# Patient Record
Sex: Male | Born: 1935 | Race: White | Hispanic: No | Marital: Married | Smoking: Former smoker
Health system: Southern US, Community
[De-identification: ages and names within clinical notes are randomized; demographics above are authoritative.]

## PROBLEM LIST (undated history)

## (undated) DIAGNOSIS — I495 Sick sinus syndrome: Secondary | ICD-10-CM

## (undated) DIAGNOSIS — I10 Essential (primary) hypertension: Secondary | ICD-10-CM

## (undated) DIAGNOSIS — E119 Type 2 diabetes mellitus without complications: Secondary | ICD-10-CM

## (undated) DIAGNOSIS — E041 Nontoxic single thyroid nodule: Secondary | ICD-10-CM

## (undated) DIAGNOSIS — I2510 Atherosclerotic heart disease of native coronary artery without angina pectoris: Secondary | ICD-10-CM

## (undated) DIAGNOSIS — E785 Hyperlipidemia, unspecified: Secondary | ICD-10-CM

## (undated) DIAGNOSIS — I4891 Unspecified atrial fibrillation: Secondary | ICD-10-CM

## (undated) DIAGNOSIS — M1120 Other chondrocalcinosis, unspecified site: Principal | ICD-10-CM

## (undated) DIAGNOSIS — N400 Enlarged prostate without lower urinary tract symptoms: Secondary | ICD-10-CM

## (undated) DIAGNOSIS — M5136 Other intervertebral disc degeneration, lumbar region: Secondary | ICD-10-CM

## (undated) HISTORY — DX: Sick sinus syndrome: I49.5

## (undated) HISTORY — DX: Hyperlipidemia, unspecified: E78.5

## (undated) HISTORY — DX: Nontoxic single thyroid nodule: E04.1

## (undated) HISTORY — PX: TONSILLECTOMY: SHX5217

## (undated) HISTORY — DX: Atherosclerotic heart disease of native coronary artery without angina pectoris: I25.10

## (undated) HISTORY — DX: Benign prostatic hyperplasia without lower urinary tract symptoms: N40.0

## (undated) HISTORY — DX: Unspecified atrial fibrillation: I48.91

## (undated) HISTORY — DX: Essential (primary) hypertension: I10

## (undated) HISTORY — PX: OTHER SURGICAL HISTORY: SHX169

## (undated) HISTORY — DX: Type 2 diabetes mellitus without complications: E11.9

## (undated) HISTORY — DX: Other intervertebral disc degeneration, lumbar region: M51.36

## (undated) HISTORY — DX: Other chondrocalcinosis, unspecified site: M11.20

---

## 1972-03-30 DIAGNOSIS — I2510 Atherosclerotic heart disease of native coronary artery without angina pectoris: Secondary | ICD-10-CM

## 1972-03-30 HISTORY — DX: Atherosclerotic heart disease of native coronary artery without angina pectoris: I25.10

## 1992-03-30 HISTORY — PX: CORONARY ARTERY BYPASS GRAFT: SHX141

## 1998-02-06 ENCOUNTER — Emergency Department (HOSPITAL_COMMUNITY): Admission: EM | Admit: 1998-02-06 | Discharge: 1998-02-06 | Payer: Self-pay | Admitting: Emergency Medicine

## 2000-07-05 ENCOUNTER — Encounter: Payer: Self-pay | Admitting: Emergency Medicine

## 2000-07-05 ENCOUNTER — Inpatient Hospital Stay (HOSPITAL_COMMUNITY): Admission: EM | Admit: 2000-07-05 | Discharge: 2000-07-06 | Payer: Self-pay | Admitting: Emergency Medicine

## 2001-03-30 DIAGNOSIS — E119 Type 2 diabetes mellitus without complications: Secondary | ICD-10-CM

## 2001-03-30 HISTORY — DX: Type 2 diabetes mellitus without complications: E11.9

## 2002-09-23 ENCOUNTER — Inpatient Hospital Stay (HOSPITAL_COMMUNITY): Admission: EM | Admit: 2002-09-23 | Discharge: 2002-09-30 | Payer: Self-pay | Admitting: Emergency Medicine

## 2002-09-23 ENCOUNTER — Encounter: Payer: Self-pay | Admitting: Emergency Medicine

## 2002-09-27 ENCOUNTER — Encounter: Payer: Self-pay | Admitting: Internal Medicine

## 2003-04-15 ENCOUNTER — Emergency Department (HOSPITAL_COMMUNITY): Admission: EM | Admit: 2003-04-15 | Discharge: 2003-04-15 | Payer: Self-pay | Admitting: Emergency Medicine

## 2003-04-23 ENCOUNTER — Encounter: Admission: RE | Admit: 2003-04-23 | Discharge: 2003-04-23 | Payer: Self-pay | Admitting: Internal Medicine

## 2004-02-18 ENCOUNTER — Ambulatory Visit: Payer: Self-pay | Admitting: Cardiovascular Disease

## 2004-03-03 ENCOUNTER — Ambulatory Visit: Payer: Self-pay | Admitting: Cardiology

## 2004-03-06 ENCOUNTER — Ambulatory Visit: Payer: Self-pay | Admitting: Cardiology

## 2004-03-17 ENCOUNTER — Ambulatory Visit: Payer: Self-pay | Admitting: Cardiology

## 2004-04-02 ENCOUNTER — Ambulatory Visit: Payer: Self-pay | Admitting: Internal Medicine

## 2004-04-14 ENCOUNTER — Ambulatory Visit: Payer: Self-pay | Admitting: *Deleted

## 2004-04-28 ENCOUNTER — Ambulatory Visit: Payer: Self-pay | Admitting: Internal Medicine

## 2004-05-12 ENCOUNTER — Ambulatory Visit: Payer: Self-pay | Admitting: Cardiology

## 2004-06-02 ENCOUNTER — Ambulatory Visit: Payer: Self-pay | Admitting: Cardiology

## 2004-06-03 ENCOUNTER — Ambulatory Visit: Payer: Self-pay | Admitting: Internal Medicine

## 2004-06-24 ENCOUNTER — Encounter: Admission: RE | Admit: 2004-06-24 | Discharge: 2004-09-22 | Payer: Self-pay | Admitting: Internal Medicine

## 2004-06-30 ENCOUNTER — Ambulatory Visit: Payer: Self-pay | Admitting: Cardiology

## 2004-07-28 ENCOUNTER — Ambulatory Visit: Payer: Self-pay | Admitting: *Deleted

## 2004-08-26 ENCOUNTER — Ambulatory Visit: Payer: Self-pay | Admitting: Internal Medicine

## 2004-09-15 ENCOUNTER — Ambulatory Visit: Payer: Self-pay | Admitting: Internal Medicine

## 2004-09-23 ENCOUNTER — Ambulatory Visit: Payer: Self-pay | Admitting: Cardiology

## 2004-09-26 ENCOUNTER — Ambulatory Visit: Payer: Self-pay | Admitting: Cardiology

## 2004-10-06 ENCOUNTER — Ambulatory Visit: Payer: Self-pay | Admitting: Cardiology

## 2004-10-07 ENCOUNTER — Ambulatory Visit: Payer: Self-pay | Admitting: Cardiology

## 2004-10-15 ENCOUNTER — Ambulatory Visit: Payer: Self-pay | Admitting: Internal Medicine

## 2004-10-16 ENCOUNTER — Encounter: Admission: RE | Admit: 2004-10-16 | Discharge: 2005-01-14 | Payer: Self-pay | Admitting: Internal Medicine

## 2004-10-27 ENCOUNTER — Ambulatory Visit: Payer: Self-pay | Admitting: Internal Medicine

## 2004-10-28 ENCOUNTER — Ambulatory Visit: Payer: Self-pay

## 2004-11-25 ENCOUNTER — Ambulatory Visit: Payer: Self-pay | Admitting: Cardiology

## 2004-12-05 ENCOUNTER — Ambulatory Visit: Payer: Self-pay

## 2004-12-08 ENCOUNTER — Ambulatory Visit: Payer: Self-pay | Admitting: Cardiology

## 2004-12-19 ENCOUNTER — Ambulatory Visit: Payer: Self-pay | Admitting: Cardiology

## 2005-01-09 ENCOUNTER — Ambulatory Visit: Payer: Self-pay | Admitting: Internal Medicine

## 2005-01-12 ENCOUNTER — Ambulatory Visit: Payer: Self-pay | Admitting: Cardiology

## 2005-01-22 ENCOUNTER — Ambulatory Visit: Payer: Self-pay | Admitting: Internal Medicine

## 2005-01-26 ENCOUNTER — Encounter (INDEPENDENT_AMBULATORY_CARE_PROVIDER_SITE_OTHER): Payer: Self-pay | Admitting: *Deleted

## 2005-01-26 ENCOUNTER — Ambulatory Visit: Payer: Self-pay | Admitting: Internal Medicine

## 2005-01-26 LAB — CONVERTED CEMR LAB: RBC count: 4.22 10*6/uL

## 2005-02-17 ENCOUNTER — Ambulatory Visit: Payer: Self-pay | Admitting: Internal Medicine

## 2005-02-20 ENCOUNTER — Ambulatory Visit: Payer: Self-pay | Admitting: Internal Medicine

## 2005-03-13 ENCOUNTER — Ambulatory Visit: Payer: Self-pay | Admitting: Cardiology

## 2005-04-02 ENCOUNTER — Ambulatory Visit: Payer: Self-pay | Admitting: Cardiology

## 2005-04-30 ENCOUNTER — Ambulatory Visit: Payer: Self-pay | Admitting: Cardiology

## 2005-05-01 ENCOUNTER — Ambulatory Visit: Payer: Self-pay | Admitting: Internal Medicine

## 2005-05-28 ENCOUNTER — Ambulatory Visit: Payer: Self-pay | Admitting: Cardiology

## 2005-05-28 ENCOUNTER — Ambulatory Visit: Payer: Self-pay | Admitting: Internal Medicine

## 2005-06-03 ENCOUNTER — Ambulatory Visit: Payer: Self-pay | Admitting: Cardiology

## 2005-06-09 ENCOUNTER — Ambulatory Visit: Payer: Self-pay

## 2005-06-18 ENCOUNTER — Ambulatory Visit (HOSPITAL_COMMUNITY): Admission: RE | Admit: 2005-06-18 | Discharge: 2005-06-18 | Payer: Self-pay | Admitting: Cardiology

## 2005-06-23 ENCOUNTER — Ambulatory Visit: Payer: Self-pay | Admitting: Cardiology

## 2005-06-25 ENCOUNTER — Ambulatory Visit: Payer: Self-pay | Admitting: Internal Medicine

## 2005-07-07 ENCOUNTER — Ambulatory Visit: Payer: Self-pay

## 2005-07-23 ENCOUNTER — Ambulatory Visit: Payer: Self-pay | Admitting: *Deleted

## 2005-08-17 ENCOUNTER — Ambulatory Visit: Payer: Self-pay | Admitting: Internal Medicine

## 2005-09-09 ENCOUNTER — Ambulatory Visit: Payer: Self-pay | Admitting: Internal Medicine

## 2005-09-23 ENCOUNTER — Encounter (INDEPENDENT_AMBULATORY_CARE_PROVIDER_SITE_OTHER): Payer: Self-pay | Admitting: *Deleted

## 2005-09-23 ENCOUNTER — Ambulatory Visit: Payer: Self-pay | Admitting: Internal Medicine

## 2005-10-20 ENCOUNTER — Ambulatory Visit: Payer: Self-pay | Admitting: Cardiology

## 2005-10-23 ENCOUNTER — Ambulatory Visit: Payer: Self-pay | Admitting: Internal Medicine

## 2005-11-04 ENCOUNTER — Ambulatory Visit: Payer: Self-pay | Admitting: Internal Medicine

## 2005-11-12 ENCOUNTER — Ambulatory Visit: Payer: Self-pay | Admitting: Internal Medicine

## 2005-12-02 ENCOUNTER — Ambulatory Visit: Payer: Self-pay | Admitting: Internal Medicine

## 2005-12-30 ENCOUNTER — Ambulatory Visit: Payer: Self-pay | Admitting: Internal Medicine

## 2006-01-06 ENCOUNTER — Ambulatory Visit: Payer: Self-pay | Admitting: *Deleted

## 2006-01-12 ENCOUNTER — Ambulatory Visit: Payer: Self-pay | Admitting: Internal Medicine

## 2006-01-27 ENCOUNTER — Ambulatory Visit: Payer: Self-pay | Admitting: Internal Medicine

## 2006-02-15 ENCOUNTER — Encounter (INDEPENDENT_AMBULATORY_CARE_PROVIDER_SITE_OTHER): Payer: Self-pay | Admitting: *Deleted

## 2006-02-15 ENCOUNTER — Ambulatory Visit: Payer: Self-pay | Admitting: Internal Medicine

## 2006-02-24 ENCOUNTER — Ambulatory Visit: Payer: Self-pay | Admitting: Internal Medicine

## 2006-03-24 ENCOUNTER — Ambulatory Visit: Payer: Self-pay | Admitting: Internal Medicine

## 2006-04-04 ENCOUNTER — Emergency Department (HOSPITAL_COMMUNITY): Admission: EM | Admit: 2006-04-04 | Discharge: 2006-04-04 | Payer: Self-pay | Admitting: Emergency Medicine

## 2006-04-04 ENCOUNTER — Ambulatory Visit: Payer: Self-pay | Admitting: Cardiology

## 2006-04-05 ENCOUNTER — Ambulatory Visit: Payer: Self-pay | Admitting: Cardiology

## 2006-04-06 ENCOUNTER — Ambulatory Visit: Payer: Self-pay | Admitting: Cardiology

## 2006-04-09 ENCOUNTER — Ambulatory Visit: Payer: Self-pay | Admitting: Cardiology

## 2006-04-09 ENCOUNTER — Encounter: Payer: Self-pay | Admitting: Internal Medicine

## 2006-04-09 ENCOUNTER — Ambulatory Visit (HOSPITAL_COMMUNITY): Admission: RE | Admit: 2006-04-09 | Discharge: 2006-04-11 | Payer: Self-pay | Admitting: Cardiology

## 2006-04-09 HISTORY — PX: OTHER SURGICAL HISTORY: SHX169

## 2006-04-11 ENCOUNTER — Encounter: Payer: Self-pay | Admitting: Internal Medicine

## 2006-04-21 ENCOUNTER — Ambulatory Visit: Payer: Self-pay | Admitting: Internal Medicine

## 2006-04-26 ENCOUNTER — Ambulatory Visit: Payer: Self-pay

## 2006-04-29 ENCOUNTER — Ambulatory Visit: Payer: Self-pay | Admitting: Internal Medicine

## 2006-05-19 ENCOUNTER — Ambulatory Visit: Payer: Self-pay | Admitting: Internal Medicine

## 2006-05-24 ENCOUNTER — Ambulatory Visit: Payer: Self-pay

## 2006-06-16 ENCOUNTER — Ambulatory Visit: Payer: Self-pay | Admitting: Internal Medicine

## 2006-07-01 ENCOUNTER — Ambulatory Visit: Payer: Self-pay | Admitting: Cardiology

## 2006-07-13 ENCOUNTER — Encounter (INDEPENDENT_AMBULATORY_CARE_PROVIDER_SITE_OTHER): Payer: Self-pay | Admitting: *Deleted

## 2006-07-13 DIAGNOSIS — I10 Essential (primary) hypertension: Secondary | ICD-10-CM | POA: Insufficient documentation

## 2006-07-13 DIAGNOSIS — E119 Type 2 diabetes mellitus without complications: Secondary | ICD-10-CM | POA: Insufficient documentation

## 2006-07-13 DIAGNOSIS — N400 Enlarged prostate without lower urinary tract symptoms: Secondary | ICD-10-CM

## 2006-07-14 ENCOUNTER — Ambulatory Visit: Payer: Self-pay | Admitting: Internal Medicine

## 2006-07-15 ENCOUNTER — Ambulatory Visit: Payer: Self-pay | Admitting: Cardiology

## 2006-08-16 ENCOUNTER — Ambulatory Visit: Payer: Self-pay | Admitting: Internal Medicine

## 2006-09-15 ENCOUNTER — Ambulatory Visit: Payer: Self-pay | Admitting: Internal Medicine

## 2006-09-15 LAB — CONVERTED CEMR LAB
INR: 3
Prothrombin Time: 20.9 s

## 2006-10-13 ENCOUNTER — Ambulatory Visit: Payer: Self-pay | Admitting: Internal Medicine

## 2006-10-13 LAB — CONVERTED CEMR LAB: Prothrombin Time: 18.8 s

## 2006-10-21 ENCOUNTER — Ambulatory Visit: Payer: Self-pay | Admitting: Cardiology

## 2006-11-11 ENCOUNTER — Ambulatory Visit: Payer: Self-pay | Admitting: Internal Medicine

## 2006-11-11 LAB — CONVERTED CEMR LAB: INR: 2.3

## 2006-11-19 ENCOUNTER — Telehealth (INDEPENDENT_AMBULATORY_CARE_PROVIDER_SITE_OTHER): Payer: Self-pay | Admitting: *Deleted

## 2006-12-08 ENCOUNTER — Ambulatory Visit: Payer: Self-pay | Admitting: Internal Medicine

## 2007-01-06 ENCOUNTER — Ambulatory Visit: Payer: Self-pay | Admitting: Internal Medicine

## 2007-01-06 LAB — CONVERTED CEMR LAB: INR: 2.7

## 2007-01-12 ENCOUNTER — Ambulatory Visit: Payer: Self-pay

## 2007-01-12 ENCOUNTER — Encounter: Payer: Self-pay | Admitting: Internal Medicine

## 2007-01-31 ENCOUNTER — Telehealth (INDEPENDENT_AMBULATORY_CARE_PROVIDER_SITE_OTHER): Payer: Self-pay | Admitting: *Deleted

## 2007-02-02 ENCOUNTER — Ambulatory Visit: Payer: Self-pay | Admitting: Internal Medicine

## 2007-02-02 LAB — CONVERTED CEMR LAB: Prothrombin Time: 18.7 s

## 2007-02-23 ENCOUNTER — Telehealth (INDEPENDENT_AMBULATORY_CARE_PROVIDER_SITE_OTHER): Payer: Self-pay | Admitting: *Deleted

## 2007-02-28 ENCOUNTER — Ambulatory Visit: Payer: Self-pay | Admitting: Cardiology

## 2007-03-02 ENCOUNTER — Ambulatory Visit: Payer: Self-pay | Admitting: Internal Medicine

## 2007-03-02 LAB — CONVERTED CEMR LAB
INR: 2.9
Prothrombin Time: 20.6 s

## 2007-03-03 LAB — CONVERTED CEMR LAB
ALT: 20 units/L (ref 0–53)
AST: 20 units/L (ref 0–37)
CO2: 28 meq/L (ref 19–32)
Calcium: 9.2 mg/dL (ref 8.4–10.5)
Chloride: 104 meq/L (ref 96–112)
HDL: 41.1 mg/dL (ref >39.0)
Hgb A1c MFr Bld: 6.1 % — ABNORMAL HIGH (ref 4.6–6.0)
LDL Cholesterol: 88 mg/dL (ref 0–99)
Potassium: 4.3 meq/L (ref 3.5–5.1)
Total CHOL/HDL Ratio: 3.6
VLDL: 18 mg/dL (ref 0–40)

## 2007-03-07 ENCOUNTER — Ambulatory Visit: Payer: Self-pay | Admitting: Internal Medicine

## 2007-03-30 ENCOUNTER — Ambulatory Visit: Payer: Self-pay | Admitting: Internal Medicine

## 2007-03-30 LAB — CONVERTED CEMR LAB
INR: 3.2
Prothrombin Time: 21.5 s

## 2007-04-22 ENCOUNTER — Ambulatory Visit: Payer: Self-pay

## 2007-04-27 ENCOUNTER — Ambulatory Visit: Payer: Self-pay | Admitting: Internal Medicine

## 2007-04-27 LAB — CONVERTED CEMR LAB
INR: 2.5
Prothrombin Time: 19.3 s

## 2007-05-25 ENCOUNTER — Ambulatory Visit: Payer: Self-pay | Admitting: Internal Medicine

## 2007-05-25 LAB — CONVERTED CEMR LAB
INR: 2
Prothrombin Time: 17.3 s

## 2007-05-29 DIAGNOSIS — E041 Nontoxic single thyroid nodule: Secondary | ICD-10-CM

## 2007-06-01 ENCOUNTER — Ambulatory Visit: Payer: Self-pay | Admitting: Internal Medicine

## 2007-06-06 ENCOUNTER — Telehealth: Payer: Self-pay | Admitting: Family Medicine

## 2007-06-22 ENCOUNTER — Ambulatory Visit: Payer: Self-pay | Admitting: Internal Medicine

## 2007-07-06 ENCOUNTER — Ambulatory Visit: Payer: Self-pay | Admitting: Internal Medicine

## 2007-07-06 LAB — CONVERTED CEMR LAB
INR: 3.4
Prothrombin Time: 22.4 s

## 2007-07-08 ENCOUNTER — Encounter: Payer: Self-pay | Admitting: Internal Medicine

## 2007-07-12 ENCOUNTER — Ambulatory Visit: Payer: Self-pay

## 2007-07-20 ENCOUNTER — Ambulatory Visit: Payer: Self-pay | Admitting: Internal Medicine

## 2007-07-20 LAB — CONVERTED CEMR LAB: Prothrombin Time: 21.2 s

## 2007-08-03 ENCOUNTER — Ambulatory Visit: Payer: Self-pay | Admitting: Internal Medicine

## 2007-08-03 LAB — CONVERTED CEMR LAB: INR: 2.2

## 2007-08-31 ENCOUNTER — Ambulatory Visit: Payer: Self-pay | Admitting: Internal Medicine

## 2007-08-31 LAB — CONVERTED CEMR LAB
INR: 3.8
Prothrombin Time: 23.6 s

## 2007-09-07 ENCOUNTER — Ambulatory Visit: Payer: Self-pay | Admitting: Internal Medicine

## 2007-09-07 LAB — CONVERTED CEMR LAB: INR: 1.8

## 2007-09-15 ENCOUNTER — Ambulatory Visit: Payer: Self-pay | Admitting: Cardiology

## 2007-09-16 ENCOUNTER — Telehealth: Payer: Self-pay | Admitting: Internal Medicine

## 2007-09-29 ENCOUNTER — Telehealth (INDEPENDENT_AMBULATORY_CARE_PROVIDER_SITE_OTHER): Payer: Self-pay | Admitting: *Deleted

## 2007-10-12 ENCOUNTER — Ambulatory Visit: Payer: Self-pay | Admitting: Internal Medicine

## 2007-10-12 LAB — CONVERTED CEMR LAB: Prothrombin Time: 20.5 s

## 2007-10-13 LAB — CONVERTED CEMR LAB
Bilirubin, Direct: 0.1 mg/dL (ref 0.0–0.3)
CO2: 28 meq/L (ref 19–32)
Cholesterol: 124 mg/dL (ref 0–200)
Creatinine, Ser: 0.9 mg/dL (ref 0.4–1.5)
GFR calc Af Amer: 103 mL/min
GFR calc non Af Amer: 85 mL/min
Glucose, Bld: 102 mg/dL — ABNORMAL HIGH (ref 70–99)
Hgb A1c MFr Bld: 6.2 % — ABNORMAL HIGH (ref 4.6–6.0)
Sodium: 141 meq/L (ref 135–145)
Triglycerides: 71 mg/dL (ref 0–149)

## 2007-10-17 ENCOUNTER — Ambulatory Visit: Payer: Self-pay | Admitting: Internal Medicine

## 2007-11-08 ENCOUNTER — Telehealth (INDEPENDENT_AMBULATORY_CARE_PROVIDER_SITE_OTHER): Payer: Self-pay | Admitting: *Deleted

## 2007-11-09 ENCOUNTER — Ambulatory Visit: Payer: Self-pay | Admitting: Internal Medicine

## 2007-11-09 LAB — CONVERTED CEMR LAB: INR: 3.5

## 2007-11-21 ENCOUNTER — Ambulatory Visit: Payer: Self-pay | Admitting: Internal Medicine

## 2007-12-21 ENCOUNTER — Ambulatory Visit: Payer: Self-pay | Admitting: Internal Medicine

## 2007-12-21 DIAGNOSIS — E785 Hyperlipidemia, unspecified: Secondary | ICD-10-CM | POA: Insufficient documentation

## 2007-12-21 LAB — CONVERTED CEMR LAB
INR: 4.8
Prothrombin Time: 26.6 s

## 2007-12-23 LAB — CONVERTED CEMR LAB
AST: 18 units/L (ref 0–37)
Alkaline Phosphatase: 87 units/L (ref 39–117)
BUN: 34 mg/dL — ABNORMAL HIGH (ref 6–23)
CO2: 27 meq/L (ref 19–32)
Chloride: 106 meq/L (ref 96–112)
Creatinine, Ser: 1.2 mg/dL (ref 0.4–1.5)
Free T4: 1 ng/dL (ref 0.6–1.6)
HDL: 39 mg/dL (ref >39.0)
LDL Cholesterol: 61 mg/dL (ref 0–99)
Potassium: 4.5 meq/L (ref 3.5–5.1)
TSH: 1.14 microintl units/mL (ref 0.35–5.50)
Total CHOL/HDL Ratio: 3.3
Triglycerides: 148 mg/dL (ref 0–149)

## 2007-12-28 ENCOUNTER — Ambulatory Visit: Payer: Self-pay | Admitting: Internal Medicine

## 2007-12-28 LAB — CONVERTED CEMR LAB: INR: 2.1

## 2008-01-11 ENCOUNTER — Ambulatory Visit: Payer: Self-pay | Admitting: Internal Medicine

## 2008-01-11 LAB — CONVERTED CEMR LAB: Prothrombin Time: 15.6 s

## 2008-01-25 ENCOUNTER — Ambulatory Visit: Payer: Self-pay | Admitting: Internal Medicine

## 2008-01-25 LAB — CONVERTED CEMR LAB
INR: 2.7
Prothrombin Time: 20 s

## 2008-02-03 ENCOUNTER — Encounter: Payer: Self-pay | Admitting: Internal Medicine

## 2008-02-07 ENCOUNTER — Telehealth: Payer: Self-pay | Admitting: Internal Medicine

## 2008-02-29 ENCOUNTER — Ambulatory Visit: Payer: Self-pay | Admitting: Internal Medicine

## 2008-02-29 LAB — CONVERTED CEMR LAB
INR: 3.1
Prothrombin Time: 21.1 s

## 2008-03-02 ENCOUNTER — Ambulatory Visit: Payer: Self-pay | Admitting: Cardiology

## 2008-03-28 ENCOUNTER — Ambulatory Visit: Payer: Self-pay | Admitting: Internal Medicine

## 2008-03-28 LAB — CONVERTED CEMR LAB: INR: 3.3

## 2008-04-09 ENCOUNTER — Ambulatory Visit: Payer: Self-pay | Admitting: Internal Medicine

## 2008-04-25 ENCOUNTER — Ambulatory Visit: Payer: Self-pay | Admitting: Internal Medicine

## 2008-05-09 ENCOUNTER — Ambulatory Visit: Payer: Self-pay | Admitting: Internal Medicine

## 2008-05-09 LAB — CONVERTED CEMR LAB: Prothrombin Time: 14.5 s

## 2008-05-16 ENCOUNTER — Ambulatory Visit: Payer: Self-pay | Admitting: Internal Medicine

## 2008-05-23 ENCOUNTER — Ambulatory Visit: Payer: Self-pay | Admitting: Family Medicine

## 2008-05-23 LAB — CONVERTED CEMR LAB
INR: 1.9
Prothrombin Time: 16.8 s

## 2008-06-20 ENCOUNTER — Ambulatory Visit: Payer: Self-pay | Admitting: Family Medicine

## 2008-06-22 ENCOUNTER — Telehealth: Payer: Self-pay | Admitting: Internal Medicine

## 2008-07-11 ENCOUNTER — Ambulatory Visit: Payer: Self-pay | Admitting: Internal Medicine

## 2008-07-11 LAB — CONVERTED CEMR LAB
ALT: 17 units/L (ref 0–53)
AST: 19 units/L (ref 0–37)
BUN: 20 mg/dL (ref 6–23)
Basophils Absolute: 0 10*3/uL (ref 0.0–0.1)
CO2: 29 meq/L (ref 19–32)
Chloride: 109 meq/L (ref 96–112)
Creatinine, Ser: 0.9 mg/dL (ref 0.4–1.5)
Eosinophils Absolute: 0.1 10*3/uL (ref 0.0–0.7)
HCT: 41 % (ref 39.0–52.0)
HDL: 42.3 mg/dL (ref >39.00)
Hgb A1c MFr Bld: 5.8 % (ref 4.6–6.5)
LDL Cholesterol: 60 mg/dL (ref 0–99)
Lymphocytes Relative: 35.9 % (ref 12.0–46.0)
MCV: 103.7 fL — ABNORMAL HIGH (ref 78.0–100.0)
Neutrophils Relative %: 53.2 % (ref 43.0–77.0)
Platelets: 193 10*3/uL (ref 150.0–400.0)
Potassium: 4.2 meq/L (ref 3.5–5.1)
RDW: 12.1 % (ref 11.5–14.6)
Sodium: 143 meq/L (ref 135–145)
Total Bilirubin: 1 mg/dL (ref 0.3–1.2)
Total CHOL/HDL Ratio: 3
Triglycerides: 60 mg/dL (ref 0.0–149.0)
VLDL: 12 mg/dL (ref 0.0–40.0)
WBC: 4.7 10*3/uL (ref 4.5–10.5)

## 2008-07-19 ENCOUNTER — Encounter (INDEPENDENT_AMBULATORY_CARE_PROVIDER_SITE_OTHER): Payer: Self-pay | Admitting: *Deleted

## 2008-07-25 ENCOUNTER — Ambulatory Visit: Payer: Self-pay | Admitting: Internal Medicine

## 2008-07-25 LAB — CONVERTED CEMR LAB: INR: 2.3

## 2008-08-08 ENCOUNTER — Ambulatory Visit: Payer: Self-pay | Admitting: Internal Medicine

## 2008-08-08 LAB — CONVERTED CEMR LAB: INR: 2.4

## 2008-08-31 ENCOUNTER — Ambulatory Visit: Payer: Self-pay | Admitting: Cardiology

## 2008-08-31 DIAGNOSIS — I495 Sick sinus syndrome: Secondary | ICD-10-CM

## 2008-08-31 DIAGNOSIS — I4891 Unspecified atrial fibrillation: Secondary | ICD-10-CM

## 2008-08-31 DIAGNOSIS — I25810 Atherosclerosis of coronary artery bypass graft(s) without angina pectoris: Secondary | ICD-10-CM | POA: Insufficient documentation

## 2008-09-05 ENCOUNTER — Ambulatory Visit: Payer: Self-pay | Admitting: Internal Medicine

## 2008-09-05 LAB — CONVERTED CEMR LAB
INR: 2
Prothrombin Time: 17.3 s

## 2008-10-02 ENCOUNTER — Telehealth: Payer: Self-pay | Admitting: Internal Medicine

## 2008-10-03 ENCOUNTER — Ambulatory Visit: Payer: Self-pay | Admitting: Internal Medicine

## 2008-10-03 LAB — CONVERTED CEMR LAB: Prothrombin Time: 17.5 s

## 2008-10-31 ENCOUNTER — Ambulatory Visit: Payer: Self-pay | Admitting: Internal Medicine

## 2008-10-31 LAB — CONVERTED CEMR LAB: INR: 2.4

## 2008-11-26 ENCOUNTER — Ambulatory Visit: Payer: Self-pay | Admitting: Internal Medicine

## 2008-11-28 ENCOUNTER — Ambulatory Visit: Payer: Self-pay | Admitting: Internal Medicine

## 2008-12-04 ENCOUNTER — Telehealth: Payer: Self-pay | Admitting: Internal Medicine

## 2008-12-26 ENCOUNTER — Ambulatory Visit: Payer: Self-pay | Admitting: Internal Medicine

## 2008-12-26 LAB — CONVERTED CEMR LAB
INR: 2.8
Prothrombin Time: 20.4 s

## 2009-01-02 ENCOUNTER — Ambulatory Visit: Payer: Self-pay | Admitting: Internal Medicine

## 2009-01-02 ENCOUNTER — Telehealth: Payer: Self-pay | Admitting: Internal Medicine

## 2009-01-16 ENCOUNTER — Ambulatory Visit: Payer: Self-pay | Admitting: Internal Medicine

## 2009-01-18 ENCOUNTER — Ambulatory Visit: Payer: Self-pay | Admitting: Internal Medicine

## 2009-01-18 DIAGNOSIS — I10 Essential (primary) hypertension: Secondary | ICD-10-CM

## 2009-01-21 LAB — CONVERTED CEMR LAB
ALT: 16 units/L (ref 0–53)
AST: 21 units/L (ref 0–37)
Albumin: 4.2 g/dL (ref 3.5–5.2)
Basophils Absolute: 0 10*3/uL (ref 0.0–0.1)
Bilirubin, Direct: 0 mg/dL (ref 0.0–0.3)
CO2: 28 meq/L (ref 19–32)
Calcium: 8.9 mg/dL (ref 8.4–10.5)
Cholesterol: 147 mg/dL (ref 0–200)
Creatinine, Ser: 0.9 mg/dL (ref 0.4–1.5)
Eosinophils Absolute: 0.1 10*3/uL (ref 0.0–0.7)
Glucose, Bld: 107 mg/dL — ABNORMAL HIGH (ref 70–99)
HDL: 53.7 mg/dL (ref >39.00)
Hgb A1c MFr Bld: 6.1 % (ref 4.6–6.5)
Lymphocytes Relative: 37.7 % (ref 12.0–46.0)
MCHC: 33.4 g/dL (ref 30.0–36.0)
Monocytes Relative: 10.6 % (ref 3.0–12.0)
Neutrophils Relative %: 49.5 % (ref 43.0–77.0)
RBC: 4.18 M/uL — ABNORMAL LOW (ref 4.22–5.81)
RDW: 13 % (ref 11.5–14.6)
Sodium: 142 meq/L (ref 135–145)
Total Bilirubin: 0.9 mg/dL (ref 0.3–1.2)
Total Protein: 7.2 g/dL (ref 6.0–8.3)
VLDL: 13 mg/dL (ref 0.0–40.0)

## 2009-01-23 ENCOUNTER — Ambulatory Visit: Payer: Self-pay | Admitting: Internal Medicine

## 2009-01-23 LAB — CONVERTED CEMR LAB
INR: 2.4
Prothrombin Time: 18.7 s

## 2009-02-18 ENCOUNTER — Encounter: Payer: Self-pay | Admitting: Internal Medicine

## 2009-02-20 ENCOUNTER — Ambulatory Visit: Payer: Self-pay | Admitting: Internal Medicine

## 2009-02-27 ENCOUNTER — Ambulatory Visit: Payer: Self-pay | Admitting: Cardiology

## 2009-03-20 ENCOUNTER — Ambulatory Visit: Payer: Self-pay | Admitting: Internal Medicine

## 2009-04-17 ENCOUNTER — Ambulatory Visit: Payer: Self-pay | Admitting: Internal Medicine

## 2009-04-17 LAB — CONVERTED CEMR LAB: Prothrombin Time: 16.2 s

## 2009-04-29 ENCOUNTER — Ambulatory Visit: Payer: Self-pay | Admitting: Internal Medicine

## 2009-05-13 ENCOUNTER — Telehealth (INDEPENDENT_AMBULATORY_CARE_PROVIDER_SITE_OTHER): Payer: Self-pay | Admitting: *Deleted

## 2009-05-14 ENCOUNTER — Ambulatory Visit: Payer: Self-pay | Admitting: Internal Medicine

## 2009-05-14 LAB — CONVERTED CEMR LAB: INR: 3.6

## 2009-05-15 LAB — CONVERTED CEMR LAB: Hgb A1c MFr Bld: 6.3 % (ref 4.6–6.5)

## 2009-05-22 ENCOUNTER — Ambulatory Visit: Payer: Self-pay | Admitting: Internal Medicine

## 2009-05-22 LAB — CONVERTED CEMR LAB: INR: 2

## 2009-05-30 ENCOUNTER — Telehealth: Payer: Self-pay | Admitting: Cardiology

## 2009-06-19 ENCOUNTER — Ambulatory Visit: Payer: Self-pay | Admitting: Internal Medicine

## 2009-06-27 ENCOUNTER — Encounter: Payer: Self-pay | Admitting: Internal Medicine

## 2009-06-28 ENCOUNTER — Ambulatory Visit: Payer: Self-pay | Admitting: Internal Medicine

## 2009-07-17 ENCOUNTER — Ambulatory Visit: Payer: Self-pay | Admitting: Internal Medicine

## 2009-08-14 ENCOUNTER — Ambulatory Visit: Payer: Self-pay | Admitting: Internal Medicine

## 2009-08-14 LAB — CONVERTED CEMR LAB: Prothrombin Time: 24.9 s

## 2009-08-29 ENCOUNTER — Ambulatory Visit: Payer: Self-pay | Admitting: Cardiology

## 2009-09-13 ENCOUNTER — Ambulatory Visit: Payer: Self-pay | Admitting: Internal Medicine

## 2009-09-13 LAB — CONVERTED CEMR LAB: INR: 2.4

## 2009-09-16 ENCOUNTER — Telehealth (INDEPENDENT_AMBULATORY_CARE_PROVIDER_SITE_OTHER): Payer: Self-pay | Admitting: *Deleted

## 2009-10-09 ENCOUNTER — Ambulatory Visit: Payer: Self-pay | Admitting: Internal Medicine

## 2009-10-09 LAB — CONVERTED CEMR LAB
INR: 1.3
Prothrombin Time: 15.3 s

## 2009-10-11 ENCOUNTER — Ambulatory Visit: Payer: Self-pay | Admitting: Cardiology

## 2009-10-18 ENCOUNTER — Ambulatory Visit: Payer: Self-pay | Admitting: Internal Medicine

## 2009-10-18 LAB — CONVERTED CEMR LAB
INR: 1.5
Prothrombin Time: 18.1 s

## 2009-10-23 ENCOUNTER — Telehealth (INDEPENDENT_AMBULATORY_CARE_PROVIDER_SITE_OTHER): Payer: Self-pay | Admitting: *Deleted

## 2009-11-01 ENCOUNTER — Ambulatory Visit: Payer: Self-pay | Admitting: Internal Medicine

## 2009-11-27 ENCOUNTER — Ambulatory Visit: Payer: Self-pay | Admitting: Internal Medicine

## 2009-11-27 LAB — CONVERTED CEMR LAB: INR: 2.1

## 2009-12-25 ENCOUNTER — Ambulatory Visit: Payer: Self-pay | Admitting: Internal Medicine

## 2009-12-25 LAB — CONVERTED CEMR LAB: INR: 2.5

## 2010-01-22 ENCOUNTER — Ambulatory Visit: Payer: Self-pay | Admitting: Internal Medicine

## 2010-01-22 LAB — CONVERTED CEMR LAB
INR: 1.9
Prothrombin Time: 23.3 s

## 2010-02-19 ENCOUNTER — Ambulatory Visit: Payer: Self-pay | Admitting: Internal Medicine

## 2010-02-19 LAB — CONVERTED CEMR LAB: INR: 2.3

## 2010-02-27 LAB — HM DIABETES EYE EXAM

## 2010-02-28 ENCOUNTER — Ambulatory Visit: Payer: Self-pay | Admitting: Internal Medicine

## 2010-02-28 DIAGNOSIS — Z950 Presence of cardiac pacemaker: Secondary | ICD-10-CM

## 2010-03-17 LAB — CONVERTED CEMR LAB: Prothrombin Time: 28.9 s

## 2010-03-19 ENCOUNTER — Ambulatory Visit: Payer: Self-pay | Admitting: Internal Medicine

## 2010-04-15 ENCOUNTER — Telehealth (INDEPENDENT_AMBULATORY_CARE_PROVIDER_SITE_OTHER): Payer: Self-pay | Admitting: *Deleted

## 2010-04-15 ENCOUNTER — Other Ambulatory Visit: Payer: Self-pay | Admitting: Cardiology

## 2010-04-15 ENCOUNTER — Ambulatory Visit
Admission: RE | Admit: 2010-04-15 | Discharge: 2010-04-15 | Payer: Self-pay | Source: Home / Self Care | Attending: Cardiology | Admitting: Cardiology

## 2010-04-15 LAB — BASIC METABOLIC PANEL
BUN: 24 mg/dL — ABNORMAL HIGH (ref 6–23)
CO2: 24 mEq/L (ref 19–32)
Calcium: 9 mg/dL (ref 8.4–10.5)
Chloride: 109 mEq/L (ref 96–112)
Creatinine, Ser: 1 mg/dL (ref 0.4–1.5)
GFR: 79.15 mL/min (ref >60.00)
Glucose, Bld: 105 mg/dL — ABNORMAL HIGH (ref 70–99)
Potassium: 4.6 mEq/L (ref 3.5–5.1)
Sodium: 144 mEq/L (ref 135–145)

## 2010-04-15 LAB — LIPID PANEL
Cholesterol: 149 mg/dL (ref 0–200)
HDL: 59.5 mg/dL (ref >39.00)
LDL Cholesterol: 78 mg/dL (ref 0–99)
Total CHOL/HDL Ratio: 3
Triglycerides: 60 mg/dL (ref 0.0–149.0)
VLDL: 12 mg/dL (ref 0.0–40.0)

## 2010-04-15 LAB — HEPATIC FUNCTION PANEL
ALT: 16 U/L (ref 0–53)
AST: 23 U/L (ref 0–37)
Albumin: 4.2 g/dL (ref 3.5–5.2)
Alkaline Phosphatase: 77 U/L (ref 39–117)
Bilirubin, Direct: 0.1 mg/dL (ref 0.0–0.3)
Total Bilirubin: 0.7 mg/dL (ref 0.3–1.2)
Total Protein: 6.8 g/dL (ref 6.0–8.3)

## 2010-04-16 ENCOUNTER — Other Ambulatory Visit: Payer: Self-pay | Admitting: Internal Medicine

## 2010-04-16 ENCOUNTER — Ambulatory Visit
Admission: RE | Admit: 2010-04-16 | Discharge: 2010-04-16 | Payer: Self-pay | Source: Home / Self Care | Attending: Internal Medicine | Admitting: Internal Medicine

## 2010-04-16 LAB — HEMOGLOBIN A1C: Hgb A1c MFr Bld: 6.2 % (ref 4.6–6.5)

## 2010-04-21 ENCOUNTER — Ambulatory Visit
Admission: RE | Admit: 2010-04-21 | Discharge: 2010-04-21 | Payer: Self-pay | Source: Home / Self Care | Attending: Cardiology | Admitting: Cardiology

## 2010-04-21 ENCOUNTER — Encounter: Payer: Self-pay | Admitting: Cardiology

## 2010-04-25 ENCOUNTER — Encounter: Payer: Self-pay | Admitting: Cardiology

## 2010-04-28 ENCOUNTER — Ambulatory Visit
Admission: RE | Admit: 2010-04-28 | Discharge: 2010-04-28 | Payer: Self-pay | Source: Home / Self Care | Attending: Internal Medicine | Admitting: Internal Medicine

## 2010-04-28 LAB — HM DIABETES FOOT EXAM

## 2010-05-01 NOTE — Cardiovascular Report (Signed)
Summary: Office Visit   Office Visit   Imported By: Roderic Ovens 03/13/2010 12:13:06  _____________________________________________________________________  External Attachment:    Type:   Image     Comment:   External Document

## 2010-05-01 NOTE — Progress Notes (Signed)
Summary: refill request  Phone Note Refill Request Message from:  Patient on October 23, 2009 10:56 AM  Refills Requested: Medication #1:  NITROGLYCERIN 0.4 MG SUBL 1 under tongue as needed for chest pain [facility] [place]   Method Requested: Telephone to Pharmacy Initial call taken by: Glynda Jaeger,  October 23, 2009 10:57 AM  Follow-up for Phone Call        Rx faxed to pharmacy Follow-up by: Vikki Ports,  October 23, 2009 11:25 AM    Prescriptions: NITROGLYCERIN 0.4 MG SUBL (NITROGLYCERIN) 1 under tongue as needed for chest pain  #25 x 2   Entered by:   Vikki Ports   Authorized by:   Ronaldo Miyamoto, MD, [facility]   Signed by:   Vikki Ports on 10/23/2009   Method used:   Faxed to ...       [facility] (retail)       [address]       [place], [place]  [address]       Ph: [phone]       Fax: [phone]   RxID:   [id number]

## 2010-05-01 NOTE — Assessment & Plan Note (Signed)
Summary: f6w   Visit Type:  6 weeks follow up Primary Provider:  Demaris Callander  CC:  Pt. denies any more chest pains or headaches since last visitit.  History of Present Illness: Overall seems to be doing well.  He still is doing his mini boat sailing at [facility].  Doing well, and no major complaints.   Since last office visit, his chest pain has resolved.  His headache has also resolved.   Current Medications (verified): 1)  Norvasc 10 Mg Tabs (Amlodipine Besylate) .... Take 1 Tablet By Mouth Once A Day 2)  Crestor 20 Mg Tabs (Rosuvastatin Calcium) .... Take As Directed 3)  Aspir-Low 81 Mg Tbec (Aspirin) .... Take 1 Tablet By Mouth Once A Day 4)  Coumadin 5 Mg Tabs (Warfarin Sodium) .... Take As Directed 5)  Vitamin C 1000 Mg Tabs (Ascorbic Acid) .... Take 1 Tablet By Mouth Once A Day 6)  Nitroglycerin 0.4 Mg Subl (Nitroglycerin) .Marland Kitchen.. 1 Under Tongue As Needed For Chest Pain 7)  Niaspan 500 Mg  Tbcr (Niacin (Antihyperlipidemic)) .... Take 1 Tablet By Mouth Once A Day 8)  Onetouch Ultra Test  Strp (Glucose Blood) .... Test Daily 9)  Amaryl 1 Mg Tabs (Glimepiride) .... Take 1 By Mouth Once Daily 10)  Saw Palmetto Complex  Caps (Zn-Pyg Afri-Nettle-Saw Palmet) .... Take 1 Capsule By Mouth Once A Day 11)  Bec/zinc  Tabs (B Complex-C-E-Zn) .... Take 1 Tablet By Mouth Once A Day  Allergies: 1)  * Ceftin 2)  * Heparin  Past History:  Past Medical History: Last updated: 01/16/2009 Coronary artery disease- 1974-----------------------Dr Percy Comp Diabetes mellitus, type II- 2003 Hypertension Benign prostatic hypertrophy Thyroid cyst (incidental finding on ultrasound) Atrial fibrillation Hyperlipidemia Sick sinus syndrome  Past Surgical History: Last updated: Sep 15, 2008 Coronary artery bypass graft- 1994 Permanent pacemaker- dual chamber permanent pacemaker- 04/09/2006 Tonsillectomy- childhood Pilonidal cystectomy  Family History: Last updated: Sep 15, 2008 Dad died @82  MI Mom died of  Altzheimer's, DM, ASCVD 2 brothers --1 died of DM complications 2 sisters --1 died wtih DM, 1 died of fungal lung infection No prostate or colon cancer  Social History: Last updated: Sep 15, 2008 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 would accept resuscitation but no prolonged machines Not sure about feeding tube  Vital Signs:  Patient profile:   77 year old male Height:      66 inches Weight:      145 pounds BMI:     23.49 Pulse rate:   64 / minute Pulse rhythm:   regular Resp:     18 per minute BP sitting:   108 / 60  (left arm) Cuff size:   regular  Vitals Entered By: Vikki Ports (October 11, 2009 10:21 AM)  Physical Exam  General:  Well developed, well nourished, in no acute distress. Head:  normocephalic and atraumatic Eyes:  PERRLA/EOM intact; conjunctiva and lids normal. Lungs:  Clear bilaterally to auscultation and percussion. Heart:  PMI non displaced.  Normal S1 and S2.   Extremities:  No clubbing or cyanosis. Neurologic:  Alert and oriented x 3.   PPM Specifications Following MD:  Lewayne Bunting, MD     PPM Vendor:  GUIDANT     PPM Model Number:  1610     PPM Serial Number:  [id number] PPM DOI:  04/09/2006     PPM Implanting MD:  Lewayne Bunting, MD  Lead 1    Location:  [facility]     DOI: 04/09/2006     Model #: 1610     Serial #: [id number]     Status: active Lead 2    Location: RV     DOI: 04/09/2006     Model #: 1914     Serial #: [id number]     Status: active  Magnet Response Rate:  BOL 100 ERI  85  Indications:  SICK SINUS SYNDROME WITH A-FIB     PPM Follow Up Pacer Dependent:  No      Episodes Coumadin:  Yes  Parameters Mode:  DDDR     Lower Rate Limit:  60     Upper Rate Limit:  130 Paced AV Delay:  280     Sensed AV Delay:  170  Impression & Recommendations:  Problem # 1:  CAD, ARTERY BYPASS GRAFT (ICD-414.04)  His updated medication  list for this problem includes:    Norvasc 10 Mg Tabs (Amlodipine besylate) .Marland Kitchen... Take 1 tablet by mouth once a day    Aspir-low 81 Mg Tbec (Aspirin) .Marland Kitchen... Take 1 tablet by mouth once a day    Coumadin 5 Mg Tabs (Warfarin sodium) .Marland Kitchen... Take as directed    Nitroglycerin 0.4 Mg Subl (Nitroglycerin) .Marland Kitchen... 1 under tongue as needed for chest pain  Problem # 2:  PAROXYSMAL ATRIAL FIBRILLATION (ICD-427.31) continues to remain stable, and in NSR.  Doing well.  continue current regimen . His updated medication list for this problem includes:    Aspir-low 81 Mg Tbec (Aspirin) .Marland Kitchen... Take 1 tablet by mouth once a day    Coumadin 5 Mg Tabs (Warfarin sodium) .Marland Kitchen... Take as directed  Patient Instructions: 1)  Your physician recommends that you continue on your current medications as directed. Please refer to the Current Medication list given to you today. 2)  Your physician wants you to follow-up in: 6 MONTHS.  You will receive a reminder letter in the mail two months in advance. If you don't receive a letter, please call our office to schedule the follow-up appointment.

## 2010-05-01 NOTE — Progress Notes (Signed)
Summary: pt request labs  Phone Note Call from Patient   Caller: Patient Call For: Cindee Salt MD Summary of Call: Pt is comming for protime tomorrow, he request to have a1c drawn at that time. Is this ok? Initial call taken by: Liane Comber CMA Duncan Dull),  May 13, 2009 9:31 AM  Follow-up for Phone Call        it has been almost 4 months so okay to do A1c then  Follow-up by: Cindee Salt MD,  May 13, 2009 11:00 AM  Additional Follow-up for Phone Call Additional follow up Details #1::        Thanks, will add to labs/tsc

## 2010-05-01 NOTE — Assessment & Plan Note (Signed)
Summary: pacer check   Visit Type:  Follow-up Primary Provider:  Demaris Callander  CC:  Denies chest pain or shortness of breath..  History of Present Illness: [name] returns today for followup.  He is a pleasant 77 yo man with a h/o symptomatic bradycardia, CAD, HTN, and is s/p PPM.  He denies c/p, sob, or peripheral edema.  No syncope.  Current Medications (verified): 1)  Norvasc 10 Mg Tabs (Amlodipine Besylate) .... Take 1 Tablet By Mouth Once A Day 2)  Crestor 20 Mg Tabs (Rosuvastatin Calcium) .... Take As Directed 3)  Aspir-Low 81 Mg Tbec (Aspirin) .... Take 1 Tablet By Mouth Once A Day 4)  Coumadin 5 Mg Tabs (Warfarin Sodium) .... Take As Directed 5)  Vitamin C 1000 Mg Tabs (Ascorbic Acid) .... Take 1 Tablet By Mouth Once A Day 6)  Nitroglycerin 0.4 Mg Subl (Nitroglycerin) .Marland Kitchen.. 1 Under Tongue As Needed For Chest Pain 7)  Niaspan 500 Mg  Tbcr (Niacin (Antihyperlipidemic)) .... Take 1 Tablet By Mouth Once A Day 8)  Onetouch Ultra Test  Strp (Glucose Blood) .... Test Daily 9)  Amaryl 1 Mg Tabs (Glimepiride) .... Take 1 By Mouth Once Daily 10)  Saw Palmetto Complex  Caps (Zn-Pyg Afri-Nettle-Saw Palmet) .... Take 1 Capsule By Mouth Once A Day 11)  Bec/zinc  Tabs (B Complex-C-E-Zn) .... Take 1 Tablet By Mouth Once A Day  Allergies (verified): 1)  * Ceftin 2)  * Heparin  Past History:  Past Medical History: Last updated: 01/16/2009 Coronary artery disease- 1974-----------------------Dr Stuckey Diabetes mellitus, type II- 2003 Hypertension Benign prostatic hypertrophy Thyroid cyst (incidental finding on ultrasound) Atrial fibrillation Hyperlipidemia Sick sinus syndrome  Past Surgical History: Last updated: 08/31/2008 Coronary artery bypass graft- 1994 Permanent pacemaker- dual chamber permanent pacemaker- 04/09/2006 Tonsillectomy- childhood Pilonidal cystectomy  Review of Systems  The patient denies chest pain, syncope, dyspnea on exertion, and peripheral edema.    Vital  Signs:  Patient profile:   77 year old male Height:      66 inches Weight:      146.50 pounds BMI:     23.73 Pulse rate:   76 / minute BP sitting:   118 / 64  (left arm) Cuff size:   regular  Vitals Entered By: Bishop Dublin, CMA (February 28, 2010 10:32 AM)  Physical Exam  General:  Well developed, well nourished, in no acute distress. Head:  normocephalic and atraumatic Eyes:  PERRLA/EOM intact; conjunctiva and lids normal. Neck:  Neck supple, no JVD. No masses, thyromegaly or abnormal cervical nodes. Chest Wall:  Well healed PPM. Lungs:  Clear bilaterally to auscultation with no wheezes, rales, or rhonchi. Heart:  PMI non displaced.  Normal S1 and S2.   Abdomen:  Bowel sounds positive; abdomen soft and non-tender without masses, organomegaly, or hernias noted. No hepatosplenomegaly. Msk:  no spine or pelvic tenderness normal ROM of both hips without pain or tendnerness Pulses:  pulses normal in all 4 extremities Extremities:  No clubbing or cyanosis. Neurologic:  Alert and oriented x 3.   PPM Specifications Following MD:  [name] Bunting, MD     PPM Vendor:  [employer]     PPM Model Number:  8119     PPM Serial Number:  [id number] DOI:  04/09/2006     PPM Implanting MD:  [name] Bunting, MD  Lead 1    Location: [facility]     DOI: 04/09/2006     Model #: 5621     Serial #: [id number]  Status: active Lead 2    Location: [facility]     DOI: 04/09/2006     Model #: 5638     Serial #: [id number]     Status: active  Magnet Response Rate:  BOL 100 ERI  85  Indications:  SICK SINUS SYNDROME WITH A-FIB     PPM Follow Up Battery Voltage:  GOOD V     Battery Est. Longevity:  >5 yrs     Pacer Dependent:  No       PPM Device Measurements Atrium  Amplitude: 2.1 mV, Impedance: 500 ohms, Threshold: 0.5 V at 0.40 msec Right Ventricle  Amplitude: 12.0 mV, Impedance: 460 ohms, Threshold: 0.8 V at 0.40 msec  Episodes MS Episodes:  0     Coumadin:  Yes Ventricular High Rate:  0     Atrial Pacing:  81%      Ventricular Pacing:  14%  Parameters Mode:  DDDR     Lower Rate Limit:  60     Upper Rate Limit:  130 Paced AV Delay:  280     Sensed AV Delay:  170 Next Cardiology Appt Due:  08/29/2010 Tech Comments:  NORMAL DEVICE FUNCTION.  1 VHR EPISODE WITH 1:1 CONDUCTION.  NO CHANGES MADE. ROV IN 6 MTHS W/DEVICE CLINIC. Vella Kohler  February 28, 2010 10:49 AM MD Comments:  Agree with above.  Impression & Recommendations:  Problem # 1:  PAROXYSMAL ATRIAL FIBRILLATION (ICD-427.31) HIs rate is well controlled. Continue his current meds. His updated medication list for this problem includes:    Aspir-low 81 Mg Tbec (Aspirin) .Marland Kitchen... Take 1 tablet by mouth once a day    Coumadin 5 Mg Tabs (Warfarin sodium) .Marland Kitchen... Take as directed  Problem # 2:  ESSENTIAL HYPERTENSION, BENIGN (ICD-401.1) His blood pressure is well controlled.  He will continue his current meds and maintain a low sodium diet. His updated medication list for this problem includes:    Norvasc 10 Mg Tabs (Amlodipine besylate) .Marland Kitchen... Take 1 tablet by mouth once a day    Aspir-low 81 Mg Tbec (Aspirin) .Marland Kitchen... Take 1 tablet by mouth once a day  Problem # 3:  CARDIAC PACEMAKER IN SITU (ICD-V45.01) His device is working normally.  Will recheck in several months.  Patient Instructions: 1)  Your physician recommends that you schedule a follow-up appointment in: 1 year 2)  Your physician recommends that you continue on your current medications as directed. Please refer to the Current Medication list given to you today.

## 2010-05-01 NOTE — Progress Notes (Signed)
Summary: Samples and Crestor  Phone Note Call from Patient Call back at Home Phone [phone]   Caller: Patient Reason for Call: Talk to Nurse Summary of Call: request to speak with the nurse.... would not state why Initial call taken by: Migdalia Dk,  May 30, 2009 10:32 AM  Follow-up for Phone Call        Pt wanted to schedule his next appt with Dr Riley Kill.  The pt would also like to get a Rx for Crestor 20mg  to take as directed.  The pt said the 20mg  tablet is cheaper than the 10mg  tablet.  I will send a new Rx into the pt's pharmacy. The pt will take one-half of a 20mg  Crestor.  I also placed samples of Crestor and Niaspan at the front desk.  Follow-up by: Julieta Gutting, RN, BSN,  May 30, 2009 11:08 AM    New/Updated Medications: CRESTOR 20 MG TABS (ROSUVASTATIN CALCIUM) take as directed Prescriptions: CRESTOR 20 MG TABS (ROSUVASTATIN CALCIUM) take as directed  #60 x 2   Entered by:   Julieta Gutting, RN, BSN   Authorized by:   Ronaldo Miyamoto, MD, [facility]   Signed by:   Julieta Gutting, RN, BSN on 05/30/2009   Method used:   Electronically to        [facility]       [address]       [place], [place]  [address]       Ph: [phone]       Fax: [phone]   RxID:   [id number]

## 2010-05-01 NOTE — Assessment & Plan Note (Signed)
Summary: flu shot/alc  Nurse Visit   Allergies: 1)  * Ceftin 2)  * Heparin  Orders Added: 1)  Flu Vaccine 81yrs + MEDICARE PATIENTS [Q2039] 2)  Administration Flu vaccine - MCR [G0008]   Flu Vaccine Consent Questions     Do you have a history of severe allergic reactions to this vaccine? no    Any prior history of allergic reactions to egg and/or gelatin? no    Do you have a sensitivity to the preservative Thimersol? no    Do you have a past history of Guillan-Barre Syndrome? no    Do you currently have an acute febrile illness? no    Have you ever had a severe reaction to latex? no    Vaccine information given and explained to patient? yes    Are you currently pregnant? no    Lot Number:[id number]   Exp Date:09/27/2010   Site Given  Left Deltoid IM

## 2010-05-01 NOTE — Assessment & Plan Note (Signed)
Summary: f42m   Visit Type:  6 months follow up Primary Provider:  Demaris Callander  CC:  No complaints.  History of Present Illness: Patient is doing extremely well.  Denies chest pain or rapid tachypalpitiations.  No progressive shortness of breath.   Problems Prior to Update: 1)  Cardiac Pacemaker in Situ  (ICD-V45.01) 2)  Headache  (ICD-784.0) 3)  Sciatica  (ICD-724.3) 4)  Hypercholesterolemia, Pure  (ICD-272.0) 5)  Essential Hypertension, Benign  (ICD-401.1) 6)  Cad, Artery Bypass Graft  (ICD-414.04) 7)  Paroxysmal Atrial Fibrillation  (ICD-427.31) 8)  Hypertension  (ICD-401.9) 9)  Chest Pain  (ICD-786.50) 10)  Hyperlipidemia  (ICD-272.4) 11)  Bradycardia  (ICD-427.89) 12)  Need Prophylactic Vaccination&inoculation Flu  (ICD-V04.81) 13)  Thyroid Cyst  (ICD-246.2) 14)  Leg, Lower, Pain  (ICD-719.46) 15)  Encounter For Therapeutic Drug Monitoring  (ICD-V58.83) 16)  Aftercare, Long-term Use, Anticoagulants  (ICD-V58.61) 17)  Arthropathy Nos, 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 Take 1 Tablet By Mouth Once A Day 2)  Crestor 20 Mg Tabs (Rosuvastatin Calcium) .... Take As Directed 3)  Aspir-Low 81 Mg Tbec (Aspirin) .... Take 1 Tablet By Mouth Once A Day 4)  Coumadin 5 Mg Tabs (Warfarin Sodium) .... Take As Directed 5)  Vitamin C 1000 Mg Tabs (Ascorbic Acid) .... Take 1 Tablet By Mouth Once A Day 6)  Nitroglycerin 0.4 Mg Subl (Nitroglycerin) .Marland Kitchen.. 1 Under Tongue As Needed For Chest Pain 7)  Niaspan 500 Mg  Tbcr (Niacin (Antihyperlipidemic)) .... Take 1 Tablet By Mouth Once A Day 8)  Onetouch Ultra Test  Strp (Glucose Blood) .... Test Daily 9)  Amaryl 1 Mg Tabs (Glimepiride) .... Take 1 By Mouth Once Daily 10)  Saw Palmetto Complex  Caps (Zn-Pyg Afri-Nettle-Saw Palmet) .... Take 1 Capsule  By Mouth Once A Day 11)  Bec/zinc  Tabs (B Complex-C-E-Zn) .... Take 1 Tablet By Mouth Once A Day  Allergies: 1)  * Ceftin 2)  * Heparin  Past History:  Past Medical History: Last updated: 01/16/2009 Coronary artery disease- 1974-----------------------Dr Rindy Kollman Diabetes mellitus, type II- 2003 Hypertension Benign prostatic hypertrophy Thyroid cyst (incidental finding on ultrasound) Atrial fibrillation Hyperlipidemia Sick sinus syndrome  Past Surgical History: Last updated: 09/19/08 Coronary artery bypass graft- 1994 Permanent pacemaker- dual chamber permanent pacemaker- 04/09/2006 Tonsillectomy- childhood Pilonidal cystectomy  Family History: Last updated: 09-19-2008 Dad died @82  MI Mom died of Altzheimer's, DM, ASCVD 2 brothers --1 died of DM complications 2 sisters --1 died wtih DM, 1 died of fungal lung infection No prostate or colon cancer  Social History: Last updated: 09/19/2008 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 would accept resuscitation but no prolonged machines Not sure about feeding tube  Vital Signs:  Patient profile:   77 year old male Height:      66 inches Weight:      148.25 pounds BMI:     24.01 Pulse rate:   65 / minute Pulse rhythm:   regular Resp:     18 per minute BP sitting:   120 / 62  (left arm) Cuff size:   regular  Vitals Entered By: Vikki Ports (April 21, 2010 11:39 AM)  Physical Exam  General:  Well developed, well nourished, in no  acute distress. Head:  normocephalic and atraumatic Eyes:  PERRLA/EOM intact; conjunctiva and lids normal. Neck:  no JVD.   Lungs:  Clear bilaterally to auscultation and percussion. Heart:  PMI non displaced.  No significant murmur on exam   Extremities:  No clubbing or cyanosis. Neurologic:  Alert and oriented x 3.   EKG  Procedure date:   04/21/2010  Findings:      NSR.  Ocassional paced beats.  left axis deviation.  PPM Specifications Following MD:  Lewayne Bunting, MD     PPM Vendor:  [employer]     PPM Model Number:  4696     PPM Serial Number:  [id number] PPM DOI:  04/09/2006     PPM Implanting MD:  Lewayne Bunting, MD  Lead 1    Location: [facility]     DOI: 04/09/2006     Model #: 1324     Serial #: [id number]     Status: active Lead 2    Location: [facility]     DOI: 04/09/2006     Model #: 6440     Serial #: [id number]     Status: active  Magnet Response Rate:  BOL 100 ERI  85  Indications:  SICK SINUS SYNDROME WITH A-FIB     PPM Follow Up Pacer Dependent:  No      Episodes Coumadin:  Yes  Parameters Mode:  DDDR     Lower Rate Limit:  60     Upper Rate Limit:  130 Paced AV Delay:  280     Sensed AV Delay:  170  Impression & Recommendations:  Problem # 1:  CAD, ARTERY BYPASS GRAFT (ICD-414.04)  stable.  No recurrent symptoms.   His updated medication list for this problem includes:    Norvasc 10 Mg Tabs (Amlodipine besylate) .Marland Kitchen... Take 1 tablet by mouth once a day    Aspir-low 81 Mg Tbec (Aspirin) .Marland Kitchen... Take 1 tablet by mouth once a day    Coumadin 5 Mg Tabs (Warfarin sodium) .Marland Kitchen... Take as directed    Nitroglycerin 0.4 Mg Subl (Nitroglycerin) .Marland Kitchen... 1 under tongue as needed for chest pain  His updated medication list for this problem includes:    Norvasc 10 Mg Tabs (Amlodipine besylate) .Marland Kitchen... Take 1 tablet by mouth once a day    Aspir-low 81 Mg Tbec (Aspirin) .Marland Kitchen... Take 1 tablet by mouth once a day    Coumadin 5 Mg Tabs (Warfarin sodium) .Marland Kitchen... Take as directed    Nitroglycerin 0.4 Mg Subl (Nitroglycerin) .Marland Kitchen... 1 under tongue as needed for chest pain  Orders: EKG w/ Interpretation (93000)  Problem # 2:  HYPERCHOLESTEROLEMIA, PURE (ICD-272.0)  LDL 78 at present.  Otherwise ok.  His updated medication list for this problem includes:    Crestor 20 Mg Tabs (Rosuvastatin calcium) .Marland Kitchen... Take as directed    Niaspan 500 Mg Tbcr  (Niacin (antihyperlipidemic)) .Marland Kitchen... Take 1 tablet by mouth once a day  His updated medication list for this problem includes:    Crestor 20 Mg Tabs (Rosuvastatin calcium) .Marland Kitchen... Take as directed    Niaspan 500 Mg Tbcr (Niacin (antihyperlipidemic)) .Marland Kitchen... Take 1 tablet by mouth once a day  Problem # 3:  CARDIAC PACEMAKER IN SITU (ICD-V45.01) followed in pacer clinic  Problem # 4:  ESSENTIAL HYPERTENSION, BENIGN (ICD-401.1) controlled.   His updated medication list for this problem includes:    Norvasc 10 Mg Tabs (Amlodipine besylate) .Marland Kitchen... Take 1 tablet by mouth once a day    Aspir-low  81 Mg Tbec (Aspirin) .Marland Kitchen... Take 1 tablet by mouth once a day  Problem # 5:  HYPERCHOLESTEROLEMIA, PURE (ICD-272.0)  His updated medication list for this problem includes:    Crestor 20 Mg Tabs (Rosuvastatin calcium) .Marland Kitchen... Take as directed    Niaspan 500 Mg Tbcr (Niacin (antihyperlipidemic)) .Marland Kitchen... Take 1 tablet by mouth once a day  His updated medication list for this problem includes:    Crestor 20 Mg Tabs (Rosuvastatin calcium) .Marland Kitchen... Take as directed    Niaspan 500 Mg Tbcr (Niacin (antihyperlipidemic)) .Marland Kitchen... Take 1 tablet by mouth once a day  Problem # 6:  PAROXYSMAL ATRIAL FIBRILLATION (ICD-427.31) remains on anticoagulation. His updated medication list for this problem includes:    Aspir-low 81 Mg Tbec (Aspirin) .Marland Kitchen... Take 1 tablet by mouth once a day    Coumadin 5 Mg Tabs (Warfarin sodium) .Marland Kitchen... Take as directed  Patient Instructions: 1)  Your physician recommends that you continue on your current medications as directed. Please refer to the Current Medication list given to you today. 2)  Your physician wants you to follow-up in: 6 MONTHS.   You will receive a reminder letter in the mail two months in advance. If you don't receive a letter, please call our office to schedule the follow-up appointment.

## 2010-05-01 NOTE — Assessment & Plan Note (Signed)
Summary: [name]   Visit Type:  6 months follow up Primary Provider:  Demaris Callander  CC:  Some chest pains.  History of Present Illness: Occasionally has small chest pains, between 12 and 3 am, and he is able to do two mile walk without pain.  It also does not feel like his prior angina.  Also having a dull headache on the left side of his head.  He has not hit his head, or head any trauma.  Also has pains in neck and shoulder, and he uses a pint of wintergreen alcohol, and camphor spirits, along with ASA, and then rubs with that.  It seems to help.  His prior angina was all typical, exercise induced discomfort.  The quality is also different.  He denies dyspnea, or diaphoresis with the symptoms.  He only gets diaphoretic with low blood sugar.  Sometimes before bed he wil eat peanuts, so his wife wonders if this is related.  Denies head trauma, and his wife says no change in mentation.   Current Medications (verified): 1)  Norvasc 10 Mg Tabs (Amlodipine Besylate) .... Take 1 Tablet By Mouth Once A Day 2)  Crestor 20 Mg Tabs (Rosuvastatin Calcium) .... Take As Directed 3)  Aspir-Low 81 Mg Tbec (Aspirin) .... Take 1 Tablet By Mouth Once A Day 4)  Coumadin 5 Mg Tabs (Warfarin Sodium) .... Take As Directed 5)  Vitamin C 1000 Mg Tabs (Ascorbic Acid) .... Take 1 Tablet By Mouth Once A Day 6)  Nitroglycerin 0.4 Mg Subl (Nitroglycerin) .Marland Kitchen.. 1 Under Tongue As Needed For Chest Pain 7)  Niaspan 500 Mg  Tbcr (Niacin (Antihyperlipidemic)) .... Take 1 Tablet By Mouth Once A Day 8)  Onetouch Ultra Test  Strp (Glucose Blood) .... Test Daily 9)  Amaryl 1 Mg Tabs (Glimepiride) .... Take 1 By Mouth Once Daily 10)  Saw Palmetto Complex  Caps (Zn-Pyg Afri-Nettle-Saw Palmet) .... Take 1 Capsule By Mouth Once A Day 11)  Bec/zinc  Tabs (B Complex-C-E-Zn) .... Take 1 Tablet By Mouth Once A Day  Allergies: 1)  * Ceftin 2)  * Heparin  Vital Signs:  Patient profile:   77 year old male Height:      66 inches Weight:       149 pounds BMI:     24.14 Pulse rate:   62 / minute Pulse rhythm:   regular Resp:     18 per minute BP sitting:   120 / 60  (left arm) Cuff size:   regular  Vitals Entered By: Vikki Ports (August 29, 2009 8:39 AM)  Physical Exam  General:  Well developed, well nourished, in no acute distress. Head:  normocephalic and atraumatic Eyes:  PERRLA/EOM intact; conjunctiva and lids normal. Neck:  Neck supple, no JVD. No masses, thyromegaly or abnormal cervical nodes. Chest Wall:  MS healed. Lungs:  Clear bilaterally to auscultation and percussion. Heart:  PMI non displaced.  Normal S1 and S2.  Positive S4.    Abdomen:  Bowel sounds positive; abdomen soft and non-tender without masses, organomegaly, or hernias noted. No hepatosplenomegaly. Pulses:  pulses normal in all 4 extremities Extremities:  No clubbing or cyanosis. Neurologic:  Alert and oriented x 3.  No focal abnormality.  EOMI.  PEARLLA.  No field cut.  Equal strength.  No sensory deficitis.   EKG  Procedure date:  08/29/2009  Findings:      Atrial pacing.  No ST changes.    PPM Specifications Following MD:  Lewayne Bunting, MD  PPM VendorAurelio Jew     PPM Model Number:  [id number]     PPM Serial Number:  [id number] PPM DOI:  04/09/2006     PPM Implanting MD:  Lewayne Bunting, MD  Lead 1    Location: [facility]     DOI: 04/09/2006     Model #: 8119     Serial #: [id number]     Status: active Lead 2    Location: [facility]     DOI: 04/09/2006     Model #: 3086     Serial #: [id number]     Status: active  Magnet Response Rate:  BOL 100 ERI  85  Indications:  SICK SINUS SYNDROME WITH A-FIB     PPM Follow Up Pacer Dependent:  No      Episodes Coumadin:  Yes  Parameters Mode:  DDDR     Lower Rate Limit:  60     Upper Rate Limit:  130 Paced AV Delay:  280     Sensed AV Delay:  170  Impression & Recommendations:  Problem # 1:  CAD, ARTERY BYPASS GRAFT (ICD-414.04)  Discussed in detail.   Symptoms not typical.  Suggested change in pm diet.   Early followup.  If symptoms persist or worsen, will address.  His updated medication list for this problem includes:    Norvasc 10 Mg Tabs (Amlodipine besylate) .Marland Kitchen... Take 1 tablet by mouth once a day    Aspir-low 81 Mg Tbec (Aspirin) .Marland Kitchen... Take 1 tablet by mouth once a day    Coumadin 5 Mg Tabs (Warfarin sodium) .Marland Kitchen... Take as directed    Nitroglycerin 0.4 Mg Subl (Nitroglycerin) .Marland Kitchen... 1 under tongue as needed for chest pain  Orders: EKG w/ Interpretation (93000)  Problem # 2:  HYPERCHOLESTEROLEMIA, PURE (ICD-272.0)  Managed by primary care. His updated medication list for this problem includes:    Crestor 20 Mg Tabs (Rosuvastatin calcium) .Marland Kitchen... Take as directed    Niaspan 500 Mg Tbcr (Niacin (antihyperlipidemic)) .Marland Kitchen... Take 1 tablet by mouth once a day  His updated medication list for this problem includes:    Crestor 20 Mg Tabs (Rosuvastatin calcium) .Marland Kitchen... Take as directed    Niaspan 500 Mg Tbcr (Niacin (antihyperlipidemic)) .Marland Kitchen... Take 1 tablet by mouth once a day  Problem # 3:  PAROXYSMAL ATRIAL FIBRILLATION (ICD-427.31)  Currently in NSR, but without appropriate anticoagulation. His updated medication list for this problem includes:    Aspir-low 81 Mg Tbec (Aspirin) .Marland Kitchen... Take 1 tablet by mouth once a day    Coumadin 5 Mg Tabs (Warfarin sodium) .Marland Kitchen... Take as directed  His updated medication list for this problem includes:    Aspir-low 81 Mg Tbec (Aspirin) .Marland Kitchen... Take 1 tablet by mouth once a day    Coumadin 5 Mg Tabs (Warfarin sodium) .Marland Kitchen... Take as directed  Orders: EKG w/ Interpretation (93000)  Problem # 4:  HYPERTENSION (ICD-401.9) controlled. His updated medication list for this problem includes:    Norvasc 10 Mg Tabs (Amlodipine besylate) .Marland Kitchen... Take 1 tablet by mouth once a day    Aspir-low 81 Mg Tbec (Aspirin) .Marland Kitchen... Take 1 tablet by mouth once a day  His updated medication list for this problem includes:    Norvasc 10 Mg Tabs (Amlodipine besylate) .Marland Kitchen... Take 1  tablet by mouth once a day    Aspir-low 81 Mg Tbec (Aspirin) .Marland Kitchen... Take 1 tablet by mouth once a day  Problem # 5:  HEADACHE (ICD-784.0) symptoms are new.  No  focal neuro changes, or evidence of head trauma.  BP controlled.  If symptoms no better, then consider noncontrast CT to Rule out subdural.  No evidence at present, but discussed in detail with patient.  Patient Instructions: 1)  Your physician recommends that you schedule a follow-up appointment in: 6 WEEKS 2)  Your physician recommends that you continue on your current medications as directed. Please refer to the Current Medication list given to you today. 3)  Please call the office in 1 WEEK if you are still having headaches.

## 2010-05-01 NOTE — Assessment & Plan Note (Signed)
Summary: HIP PAIN/CLE   Vital Signs:  Patient profile:   77 year old male Weight:      154 pounds Temp:     98.3 degrees F oral BP sitting:   126 / 68  (left arm) Cuff size:   regular  Vitals Entered By: Mervin Hack CMA Duncan Dull) (April 29, 2009 4:55 PM) CC: hip pain x70month   History of Present Illness: having right posterior hip pain Pain occ radiates down the inside of right thigh to knee Still walking 2 miles--hobbles at first, then it eases off by the end some pain when lifts up to get in car also  No probelms in bed May have pain in hip if sits on couch in certain postiion (reclining on left side) No leg weakness  remembers pulling in legs while putting up Christmas decorations on court started with the hip pain about 3 weeks later Now radiating down leg for about 1 week  tylenol some help Horse linament helps a lot  Allergies: 1)  * Ceftin 2)  * Heparin  Past History:  Past medical, surgical, family and social histories (including risk factors) reviewed for relevance to current acute and chronic problems.  Past Medical History: Reviewed history from 01/16/2009 and no changes required. Coronary artery disease- 1974-----------------------Dr Stuckey Diabetes mellitus, type II- 2003 Hypertension Benign prostatic hypertrophy Thyroid cyst (incidental finding on ultrasound) Atrial fibrillation Hyperlipidemia Sick sinus syndrome  Past Surgical History: Reviewed history from 08/31/2008 and no changes required. Coronary artery bypass graft- 1994 Permanent pacemaker- dual chamber permanent pacemaker- 04/09/2006 Tonsillectomy- childhood Pilonidal cystectomy  Family History: Reviewed history from 08/31/2008 and no changes required. Dad died @82  MI Mom died of Altzheimer's, DM, ASCVD 2 brothers --1 died of DM complications 2 sisters --1 died wtih DM, 1 died of fungal lung infection No prostate or colon cancer  Social History: Reviewed history from  08/31/2008 and no changes required. 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 would accept resuscitation but no prolonged machines Not sure about feeding tube  Review of Systems       no change in bowel or bladder habits eating okay still does calisthenics every AM---doesn't bring on pain  Physical Exam  General:  alert and normal appearance.   Msk:  no spine or pelvic tenderness normal ROM of both hips without pain or tendnerness Neurologic:  alert & oriented X3, strength normal in all extremities, gait normal, and DTRs symmetrical and normal.   Gati--he flexes at the waist but otherwise normal   Impression & Recommendations:  Problem # 1:  SCIATICA (ICD-724.3) Assessment New probably peripheral no neuro findings loosens up with walking and exercises using linament and tylenol  counselled about continuing to stay active and signs that would be worrisome  Complete Medication List: 1)  Norvasc 10 Mg Tabs (Amlodipine besylate) .... Take 1 tablet by mouth once a day 2)  Crestor 10 Mg Tabs (Rosuvastatin calcium) .Marland Kitchen.. 1 daily 3)  Aspir-low 81 Mg Tbec (Aspirin) .... Take 1 tablet by mouth once a day 4)  Coumadin 5 Mg Tabs (Warfarin sodium) .... Take as directed 5)  Vitamin C 1000 Mg Tabs (Ascorbic acid) .... Take 1 tablet by mouth once a day 6)  Nitroglycerin 0.4 Mg Subl (Nitroglycerin) .Marland Kitchen.. 1 under tongue as needed for chest pain 7)  Niaspan 500 Mg Tbcr (Niacin (antihyperlipidemic)) .... Take 1 tablet by mouth once a day 8)  Onetouch Ultra Test Strp (Glucose blood) .... Test daily 9)  Amaryl 1 Mg Tabs (Glimepiride) .... Take 1 by mouth once daily 10)  Saw Palmetto Complex Caps (Zn-pyg afri-nettle-saw palmet) .... Take 1 capsule by mouth once a day 11)  Bec/zinc Tabs (B complex-c-e-zn) .... Take 1 tablet by mouth once a day  Patient Instructions: 1)  Please  keep regular appointment  Current Allergies (reviewed today): * CEFTIN * HEPARIN

## 2010-05-01 NOTE — Assessment & Plan Note (Signed)
Summary: F/U 6 MONTHS   Visit Type:  Follow-up Primary Provider:  Demaris Callander  CC:  no complaints.  History of Present Illness: [name] returned today for followup.  He is a very pleasant 77 year old man with a history of symptomatic bradycardia and is status post permanent pacemaker insertion.  He has dyslipidemia.  He has controlled diabetes.  He had no specific complaints today.  He denies chest pain. He denies shortness of breath.  He does have some arthritis in his shoulders bilaterally, but has otherwise been stable.    Current Problems (verified): 1)  Sciatica  (ICD-724.3) 2)  Hypercholesterolemia, Pure  (ICD-272.0) 3)  Essential Hypertension, Benign  (ICD-401.1) 4)  Cad, Artery Bypass Graft  (ICD-414.04) 5)  Paroxysmal Atrial Fibrillation  (ICD-427.31) 6)  Hypertension  (ICD-401.9) 7)  Chest Pain  (ICD-786.50) 8)  Hyperlipidemia  (ICD-272.4) 9)  Bradycardia  (ICD-427.89) 10)  Need Prophylactic Vaccination&inoculation Flu  (ICD-V04.81) 11)  Thyroid Cyst  (ICD-246.2) 12)  Leg, Lower, Pain  (ICD-719.46) 13)  Encounter For Therapeutic Drug Monitoring  (ICD-V58.83) 14)  Aftercare, Long-term Use, Anticoagulants  (ICD-V58.61) 15)  Arthropathy Nos, 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 Take 1 Tablet By Mouth Once A Day 2)  Crestor 20 Mg Tabs (Rosuvastatin Calcium) .... Take As Directed 3)  Aspir-Low 81 Mg Tbec (Aspirin) .... Take 1 Tablet By Mouth Once A Day 4)  Coumadin 5 Mg Tabs (Warfarin Sodium) .... Take As Directed 5)  Vitamin C 1000 Mg Tabs (Ascorbic Acid) .... Take 1 Tablet By Mouth Once A Day 6)  Nitroglycerin 0.4 Mg Subl (Nitroglycerin) .Marland Kitchen.. 1 Under Tongue As Needed For Chest Pain 7)  Niaspan 500 Mg  Tbcr (Niacin (Antihyperlipidemic)) .... Take 1 Tablet By Mouth Once A  Day 8)  Onetouch Ultra Test  Strp (Glucose Blood) .... Test Daily 9)  Amaryl 1 Mg Tabs (Glimepiride) .... Take 1 By Mouth Once Daily 10)  Saw Palmetto Complex  Caps (Zn-Pyg Afri-Nettle-Saw Palmet) .... Take 1 Capsule By Mouth Once A Day 11)  Bec/zinc  Tabs (B Complex-C-E-Zn) .... Take 1 Tablet By Mouth Once A Day  Allergies (verified): 1)  * Ceftin 2)  * Heparin  Past History:  Past Medical History: Last updated: 01/16/2009 Coronary artery disease- 1974-----------------------Dr Stuckey Diabetes mellitus, type II- 2003 Hypertension Benign prostatic hypertrophy Thyroid cyst (incidental finding on ultrasound) Atrial fibrillation Hyperlipidemia Sick sinus syndrome  Past Surgical History: Last updated: 09/22/08 Coronary artery bypass graft- 1994 Permanent pacemaker- dual chamber permanent pacemaker- 04/09/2006 Tonsillectomy- childhood Pilonidal cystectomy  Family History: Last updated: Sep 22, 2008 Dad died @84  MI Mom died of Altzheimer's, DM, ASCVD 2 brothers --1 died of DM complications 2 sisters --1 died wtih DM, 1 died of fungal lung infection No prostate or colon cancer  Social History: Last updated: 09-22-2008 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 would accept resuscitation but no prolonged machines Not sure about feeding tube  Risk Factors: Smoking Status: quit (03/07/2007)  Review of Systems  The patient denies chest pain, syncope, dyspnea on exertion, and peripheral edema.    Vital Signs:  Patient profile:   77 year old male Height:      66 inches Weight:      152 pounds Pulse  rate:   74 / minute Pulse rhythm:   regular BP sitting:   122 / 72  (left arm) Cuff size:   regular  Vitals Entered By: Mercer Pod (June 28, 2009 4:18 PM)  Physical Exam  General:  alert and normal appearance.   Head:  normocephalic and  atraumatic Eyes:  PERRLA/EOM intact; conjunctiva and lids normal. Neck:  Neck supple, no JVD. No masses, thyromegaly or abnormal cervical nodes. Lungs:  Clear bilaterally to auscultation. Heart:  PMI non displaced.  NOrmal S1 and S2.  Soft S4.  Minimal SEM.  No DM.  No rubs. Abdomen:  Bowel sounds positive; abdomen soft and non-tender without masses, organomegaly, or hernias noted. No hepatosplenomegaly. Msk:  no spine or pelvic tenderness normal ROM of both hips without pain or tendnerness Pulses:  pulses normal in all 4 extremities Extremities:  No clubbing or cyanosis. Neurologic:  alert & oriented X3, strength normal in all extremities, gait normal, and DTRs symmetrical and normal.   Gati--he flexes at the waist but otherwise normal   PPM Specifications Following MD:  Lewayne Bunting, MD     PPM Vendor:  [employer]     PPM Model Number:  [id number]     PPM Serial Number:  [id number] PPM DOI:  04/09/2006     PPM Implanting MD:  Lewayne Bunting, MD  Lead 1    Location: [facility]     DOI: 04/09/2006     Model #: 0981     Serial #: [id number]     Status: active Lead 2    Location: [facility]     DOI: 04/09/2006     Model #: [id number]     Serial #: [id number]     Status: active  Magnet Response Rate:  BOL 100 ERI  85  Indications:  SICK SINUS SYNDROME WITH A-FIB     PPM Follow Up Remote Check?  No Battery Voltage:  GOOD V     Battery Est. Longevity:  >5 years     Pacer Dependent:  No       PPM Device Measurements Atrium  Amplitude: 2.8 mV, Impedance: 420 ohms, Threshold: 0.6 V at 0.4 msec Right Ventricle  Amplitude: 11.2 mV, Impedance: 470 ohms, Threshold: 0.9 V at 0.4 msec  Episodes MS Episodes:  0     Percent Mode Switch:  0     Coumadin:  Yes Atrial Pacing:  79%     Ventricular Pacing:  11%  Parameters Mode:  DDDR     Lower Rate Limit:  60     Upper Rate Limit:  130 Paced AV Delay:  280     Sensed AV Delay:  170 Next Cardiology Appt Due:  12/28/2009 Tech Comments:  No parameter changes.  Device function  normal.  ROV 6months [facility]. Altha Harm, LPN  June 29, 9526 4:21 PM  MD Comments:  Agree with above.  Impression & Recommendations:  Problem # 1:  BRADYCARDIA (ICD-427.89) His PPM is working normally.  Will recheck in several months. His updated medication list for this problem includes:    Norvasc 10 Mg Tabs (Amlodipine besylate) .Marland Kitchen... Take 1 tablet by mouth once a day    Aspir-low 81 Mg Tbec (Aspirin) .Marland Kitchen... Take 1 tablet by mouth once a day    Coumadin 5 Mg Tabs (Warfarin sodium) .Marland Kitchen... Take as directed    Nitroglycerin 0.4 Mg Subl (Nitroglycerin) .Marland Kitchen... 1 under tongue as needed for chest pain  Problem # 2:  HYPERCHOLESTEROLEMIA, PURE (  ICD-272.0) He will maintain a low fat diet and continue his medical therapy. His updated medication list for this problem includes:    Crestor 20 Mg Tabs (Rosuvastatin calcium) .Marland Kitchen... Take as directed    Niaspan 500 Mg Tbcr (Niacin (antihyperlipidemic)) .Marland Kitchen... Take 1 tablet by mouth once a day

## 2010-05-01 NOTE — Progress Notes (Signed)
  Phone Note Call from Patient   Summary of Call: Patient request to have an A1C done tomorrow when he has his INR done. Initial call taken by: Mills Koller,  April 15, 2010 4:19 PM  Follow-up for Phone Call        okay to add on His last was in August Follow-up by: Cindee Salt MD,  April 16, 2010 7:54 AM  Additional Follow-up for Phone Call Additional follow up Details #1::        added Additional Follow-up by: Mills Koller,  April 16, 2010 8:07 AM

## 2010-05-05 ENCOUNTER — Encounter: Payer: Self-pay | Admitting: Internal Medicine

## 2010-05-05 ENCOUNTER — Ambulatory Visit (INDEPENDENT_AMBULATORY_CARE_PROVIDER_SITE_OTHER): Payer: Medicare Other | Admitting: Internal Medicine

## 2010-05-05 DIAGNOSIS — J019 Acute sinusitis, unspecified: Secondary | ICD-10-CM

## 2010-05-07 NOTE — Assessment & Plan Note (Signed)
Summary: F/U/CLE   Vital Signs:  Patient profile:   77 year old male Weight:      147 pounds Temp:     98.2 degrees F oral Pulse rate:   63 / minute Pulse rhythm:   regular BP sitting:   108 / 60  (left arm) Cuff size:   large  Vitals Entered By: Mervin Hack CMA Duncan Dull) (April 28, 2010 10:21 AM) CC: follow-up visit   History of Present Illness: Doing fairly well Recent cardiology check up went well  Had problem with a tooth some time ago Has had recurrent blister in mouth--some blood when he "mashed" it No pain Currently not present  Has been losing more hair Not interested in Rx but chronic cyst is now more present  HEart is okay Occ gets palpitations---seems less than in past Rare indigestion feeling--takes TUms. No nitro use No SOB No change in exercise tolerance--walks every morning for 2 miles  Diabetes fine Occ cheats on diet and it may go up some Checks three times a day still Mostly <100 fasting 150-180 post prandial No hypoglycemic reactions Again discussed cutting down to once a day  Allergies: 1)  * Ceftin 2)  * Heparin  Past History:  Past medical, surgical, family and social histories (including risk factors) reviewed for relevance to current acute and chronic problems.  Past Medical History: Reviewed history from 01/16/2009 and no changes required. Coronary artery disease- 1974-----------------------Dr Stuckey Diabetes mellitus, type II- 2003 Hypertension Benign prostatic hypertrophy Thyroid cyst (incidental finding on ultrasound) Atrial fibrillation Hyperlipidemia Sick sinus syndrome  Past Surgical History: Reviewed history from 08/31/2008 and no changes required. Coronary artery bypass graft- 1994 Permanent pacemaker- dual chamber permanent pacemaker- 04/09/2006 Tonsillectomy- childhood Pilonidal cystectomy  Family History: Reviewed history from 08/31/2008 and no changes required. Dad died @82  MI Mom died of Altzheimer's,  DM, ASCVD 2 brothers --1 died of DM complications 2 sisters --1 died wtih DM, 1 died of fungal lung infection No prostate or colon cancer  Social History: Reviewed history from 08/31/2008 and no changes required. 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 would accept resuscitation but no prolonged machines Not sure about feeding tube  Review of Systems       weight is stable Appetite is fine sleeps well Mood generally good Still with knee problems---wears brace on right knee. Occ uses linament and tylenol  Physical Exam  General:  alert and normal appearance.   Head:  cyst on top of head Mouth:  cyst across from upper left molars No induration or inflammation Neck:  supple, no masses, no thyromegaly, and no cervical lymphadenopathy.   Lungs:  normal respiratory effort, no intercostal retractions, no accessory muscle use, and normal breath sounds.   Heart:  normal rate, regular rhythm, no murmur, and no gallop.   Msk:  no joint tenderness and no joint swelling.   Pulses:  2+ in feet Extremities:  no edema Skin:  no suspicious lesions and no ulcerations.   Psych:  normally interactive, good eye contact, not anxious appearing, and not depressed appearing.    Diabetes Management Exam:    Foot Exam (with socks and/or shoes not present):       Sensory-Pinprick/Light touch:          Left medial foot (L-4): normal          Left dorsal foot (L-5): normal  Left lateral foot (S-1): normal          Right medial foot (L-4): normal          Right dorsal foot (L-5): normal          Right lateral foot (S-1): normal       Inspection:          Left foot: normal          Right foot: normal       Nails:          Left foot: normal          Right foot: normal    Eye Exam:       Eye Exam done elsewhere          Date: 02/27/2010          Results: no retinopathy           Done by: Dr Nile Riggs   Impression & Recommendations:  Problem # 1:  DIABETES MELLITUS, TYPE II (ICD-250.00) Assessment Unchanged doing well no changes If hypoglycemia--would cut meds  His updated medication list for this problem includes:    Amaryl 1 Mg Tabs (Glimepiride) .Marland Kitchen... Take 1 by mouth once daily    Aspir-low 81 Mg Tbec (Aspirin) .Marland Kitchen... Take 1 tablet by mouth once a day  Labs Reviewed: Creat: 1.0 (04/15/2010)   Microalbumin: 0.8 (09/23/2005)  Last Eye Exam: no retinopathy (02/27/2010) Reviewed HgBA1c results: 6.2 (04/16/2010)  6.0 (11/01/2009)  Problem # 2:  PAROXYSMAL ATRIAL FIBRILLATION (ICD-427.31) Assessment: Unchanged paced on coumadin  His updated medication list for this problem includes:    Norvasc 10 Mg Tabs (Amlodipine besylate) .Marland Kitchen... Take 1 tablet by mouth once a day    Coumadin 5 Mg Tabs (Warfarin sodium) .Marland Kitchen... Take as directed    Aspir-low 81 Mg Tbec (Aspirin) .Marland Kitchen... Take 1 tablet by mouth once a day  Problem # 3:  HYPERTENSION (ICD-401.9) Assessment: Unchanged Occ orthostatic symptoms would cut dose if this is more prominent  His updated medication list for this problem includes:    Norvasc 10 Mg Tabs (Amlodipine besylate) .Marland Kitchen... Take 1 tablet by mouth once a day  BP today: 108/60 Prior BP: 120/62 (04/21/2010)  Labs Reviewed: K+: 4.6 (04/15/2010) Creat: : 1.0 (04/15/2010)   Chol: 149 (04/15/2010)   HDL: 59.50 (04/15/2010)   LDL: 78 (04/15/2010)   TG: 60.0 (04/15/2010)  Problem # 4:  HYPERLIPIDEMIA (ICD-272.4) Assessment: Unchanged protective HDL and LDL is good  His updated medication list for this problem includes:    Crestor 20 Mg Tabs (Rosuvastatin calcium) .Marland Kitchen... Take as directed    Niaspan 500 Mg Tbcr (Niacin (antihyperlipidemic)) .Marland Kitchen... Take 1 tablet by mouth once a day  Labs Reviewed: SGOT: 23 (04/15/2010)   SGPT: 16 (04/15/2010)   HDL:59.50 (04/15/2010), 53.70 (01/18/2009)  LDL:78 (04/15/2010), 80 (01/18/2009)  Chol:149  (04/15/2010), 147 (01/18/2009)  Trig:60.0 (04/15/2010), 65.0 (01/18/2009)  Complete Medication List: 1)  Norvasc 10 Mg Tabs (Amlodipine besylate) .... Take 1 tablet by mouth once a day 2)  Crestor 20 Mg Tabs (Rosuvastatin calcium) .... Take as directed 3)  Amaryl 1 Mg Tabs (Glimepiride) .... Take 1 by mouth once daily 4)  Coumadin 5 Mg Tabs (Warfarin sodium) .... Take as directed 5)  Aspir-low 81 Mg Tbec (Aspirin) .... Take 1 tablet by mouth once a day 6)  Vitamin C 1000 Mg Tabs (Ascorbic acid) .... Take 1 tablet by mouth once a day 7)  Nitroglycerin 0.4 Mg Subl (Nitroglycerin) .Marland Kitchen.. 1 under  tongue as needed for chest pain 8)  Niaspan 500 Mg Tbcr (Niacin (antihyperlipidemic)) .... Take 1 tablet by mouth once a day 9)  Saw Palmetto Complex Caps (Zn-pyg afri-nettle-saw palmet) .... Take 1 capsule by mouth once a day 10)  Bec/zinc Tabs (B complex-c-e-zn) .... Take 1 tablet by mouth once a day 11)  Onetouch Ultra Test Strp (Glucose blood) .... Test daily  Patient Instructions: 1)  Please schedule a follow-up appointment in 6 months .    Orders Added: 1)  Est. Patient Level IV [16109]    Current Allergies (reviewed today): * CEFTIN * HEPARIN

## 2010-05-14 ENCOUNTER — Other Ambulatory Visit (INDEPENDENT_AMBULATORY_CARE_PROVIDER_SITE_OTHER): Payer: Medicare Other

## 2010-05-14 ENCOUNTER — Encounter: Payer: Self-pay | Admitting: Internal Medicine

## 2010-05-14 ENCOUNTER — Other Ambulatory Visit: Payer: Self-pay

## 2010-05-14 DIAGNOSIS — I25810 Atherosclerosis of coronary artery bypass graft(s) without angina pectoris: Secondary | ICD-10-CM

## 2010-05-14 DIAGNOSIS — Z5181 Encounter for therapeutic drug level monitoring: Secondary | ICD-10-CM

## 2010-05-14 DIAGNOSIS — Z7901 Long term (current) use of anticoagulants: Secondary | ICD-10-CM

## 2010-05-15 NOTE — Assessment & Plan Note (Signed)
Summary: ST,CONGESTION/CLE  MEDICARE/BCBS   Vital Signs:  Patient profile:   77 year old male Weight:      138 pounds Temp:     98.6 degrees F oral Pulse rate:   60 / minute Pulse rhythm:   regular Resp:     18 per minute BP sitting:   128 / 63  (left arm) Cuff size:   large  Vitals Entered By: Mervin Hack CMA Duncan Dull) (May 05, 2010 2:08 PM) CC: congestion   History of Present Illness: Wife was sick last week--saw Dr Dayton Martes still with some symptoms now but has started on meds---azithromycin Not clearly helping  Caught it from her--   ~3 days ago Has sore throat trying listerine--some  help  Drainage in nose to chest Better with mustard plaster last night  Cough is productive of only  a little mucus (white but mostly yellow) No fever No SOB No ear pain  Tylenol also has helped him some  Allergies: 1)  * Ceftin 2)  * Heparin  Past History:  Past medical, surgical, family and social histories (including risk factors) reviewed for relevance to current acute and chronic problems.  Past Medical History: Reviewed history from 01/16/2009 and no changes required. Coronary artery disease- 1974-----------------------Dr Stuckey Diabetes mellitus, type II- 2003 Hypertension Benign prostatic hypertrophy Thyroid cyst (incidental finding on ultrasound) Atrial fibrillation Hyperlipidemia Sick sinus syndrome  Past Surgical History: Reviewed history from 08/31/2008 and no changes required. Coronary artery bypass graft- 1994 Permanent pacemaker- dual chamber permanent pacemaker- 04/09/2006 Tonsillectomy- childhood Pilonidal cystectomy  Family History: Reviewed history from 08/31/2008 and no changes required. Dad died @82  MI Mom died of Altzheimer's, DM, ASCVD 2 brothers --1 died of DM complications 2 sisters --1 died wtih DM, 1 died of fungal lung infection No prostate or colon cancer  Social History: Reviewed history from 08/31/2008 and no changes  required. 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 would accept resuscitation but no prolonged machines Not sure about feeding tube  Review of Systems       No vomiting  no diarrhea Appetite is okay  Physical Exam  General:  alert.  NAD Head:  no sinus tenderness Nose:  moderate congestion and inflammation white mucus Mouth:  slight uvula injection no exudates Neck:  supple, no masses, and no cervical lymphadenopathy.   Lungs:  normal respiratory effort, no intercostal retractions, no accessory muscle use, normal breath sounds, no dullness, no crackles, and no wheezes.     Impression & Recommendations:  Problem # 1:  SINUSITIS - ACUTE-NOS (ICD-461.9) Assessment New  seems to be viral at this point will continue supportive care Rx given for amoxicillin if he worsens  His updated medication list for this problem includes:    Amoxicillin 500 Mg Tabs (Amoxicillin) .Marland Kitchen... 2 tabs by mouth two times a day for sinus infection  Complete Medication List: 1)  Norvasc 10 Mg Tabs (Amlodipine besylate) .... Take 1 tablet by mouth once a day 2)  Crestor 20 Mg Tabs (Rosuvastatin calcium) .... Take as directed 3)  Amaryl 1 Mg Tabs (Glimepiride) .... Take 1 by mouth once daily 4)  Coumadin 5 Mg Tabs (Warfarin sodium) .... Take as directed 5)  Aspir-low 81 Mg Tbec (Aspirin) .... Take 1 tablet by mouth once a day 6)  Vitamin C 1000 Mg Tabs (Ascorbic acid) .... Take 1 tablet by mouth once a day 7)  Nitroglycerin 0.4 Mg Subl (Nitroglycerin) .Marland Kitchen.. 1 under tongue as needed for chest pain 8)  Niaspan 500 Mg Tbcr (Niacin (antihyperlipidemic)) .... Take 1 tablet by mouth once a day 9)  Saw Palmetto Complex Caps (Zn-pyg afri-nettle-saw palmet) .... Take 1 capsule by mouth once a day 10)  Bec/zinc Tabs (B complex-c-e-zn) .... Take 1 tablet by mouth once a day 11)  Onetouch Ultra  Test Strp (Glucose blood) .... Test daily 12)  Amoxicillin 500 Mg Tabs (Amoxicillin) .... 2 tabs by mouth two times a day for sinus infection  Patient Instructions: 1)  Please start the antibioitic if you worsen--esp coughing up more purulent mucus (dark green or yellow) 2)  Keep regular follow up visit Prescriptions: AMOXICILLIN 500 MG TABS (AMOXICILLIN) 2 tabs by mouth two times a day for sinus infection  #40 x 0   Entered and Authorized by:   Cindee Salt MD   Signed by:   Cindee Salt MD on 05/05/2010   Method used:   Print then Give to Patient   RxID:   [id number]    Orders Added: 1)  Est. Patient Level III [95621]    Current Allergies (reviewed today): * CEFTIN * HEPARIN

## 2010-06-10 ENCOUNTER — Encounter: Payer: Self-pay | Admitting: Internal Medicine

## 2010-06-10 ENCOUNTER — Ambulatory Visit (INDEPENDENT_AMBULATORY_CARE_PROVIDER_SITE_OTHER): Payer: Medicare Other

## 2010-06-10 DIAGNOSIS — Z5181 Encounter for therapeutic drug level monitoring: Secondary | ICD-10-CM

## 2010-06-10 DIAGNOSIS — I4891 Unspecified atrial fibrillation: Secondary | ICD-10-CM

## 2010-06-10 DIAGNOSIS — Z7901 Long term (current) use of anticoagulants: Secondary | ICD-10-CM

## 2010-06-10 LAB — CONVERTED CEMR LAB
INR: 2.2
Prothrombin Time: 26.6 s

## 2010-06-11 ENCOUNTER — Ambulatory Visit: Payer: Medicare Other

## 2010-06-17 NOTE — Medication Information (Signed)
Summary: protime/tw   PCP: [name] Indication 1: CAD PT 26.6 INR RANGE 2.0-3.0           Allergies: 1)  * Ceftin 2)  * Heparin  Anticoagulation Management History:      Positive risk factors for bleeding include an age of 67 years or older and presence of serious comorbidities.  The bleeding index is 'intermediate risk'.  Positive CHADS2 values include History of HTN, Age > 75 years old, and History of Diabetes.  His last INR was 2.5 and today's INR is 2.2.  Prothrombin time is 26.6.    Anticoagulation Management Assessment/Plan:      The patient's current anticoagulation dose is Coumadin 5 mg tabs: take as directed.  The next INR is due 4 weeks.         ANTICOAGULATION RECORD PREVIOUS REGIMEN & LAB RESULTS Anticoagulation Diagnosis:  CAD on  04/17/2009 Previous INR Goal Range:  2.0-3.0 on  04/17/2009 Previous INR:  2.5 on  05/14/2010 Previous Coumadin Dose(mg):   2.5 mg daily, 5 mgTu,SAT on  02/19/2010 Previous Regimen:   2.5 mg daily, 5 mgTu,SAT on  02/19/2010 Previous Coagulation Comments:  . on  03/17/2010  NEW REGIMEN & LAB RESULTS Current INR: 2.2 Current Coumadin Dose(mg):  2.5 mg daily, 5 mgTu,SAT  Regimen:  2.5 mg daily, 5 mgTu,SAT   Provider: Anokhi Shannon Repeat testing in: 4 weeks Dose has been reviewed with patient or caretaker during this visit. Reviewed by: Celso Sickle  Anticoagulation Visit Questionnaire Coumadin dose missed/changed:  No Abnormal Bleeding Symptoms:  No  Any diet changes including alcohol intake, vegetables or greens since the last visit:  No Any illnesses or hospitalizations since the last visit:  No Any signs of clotting since the last visit (including chest discomfort, dizziness, shortness of breath, arm tingling, slurred speech, swelling or redness in leg):  No  MEDICATIONS NORVASC 10 MG TABS (AMLODIPINE BESYLATE) Take 1 tablet by mouth once a day CRESTOR 20 MG TABS (ROSUVASTATIN CALCIUM) take as directed AMARYL 1 MG TABS  (GLIMEPIRIDE) Take 1 by mouth once daily COUMADIN 5 MG TABS (WARFARIN SODIUM) take as directed ASPIR-LOW 81 MG TBEC (ASPIRIN) Take 1 tablet by mouth once a day VITAMIN C 1000 MG TABS (ASCORBIC ACID) Take 1 tablet by mouth once a day NITROGLYCERIN 0.4 MG SUBL (NITROGLYCERIN) 1 under tongue as needed for chest pain NIASPAN 500 MG  TBCR (NIACIN (ANTIHYPERLIPIDEMIC)) Take 1 tablet by mouth once a day SAW PALMETTO COMPLEX  CAPS (ZN-PYG AFRI-NETTLE-SAW PALMET) Take 1 capsule by mouth once a day BEC/ZINC  TABS (B COMPLEX-C-E-ZN) Take 1 tablet by mouth once a day ONETOUCH ULTRA TEST  STRP (GLUCOSE BLOOD) Test daily AMOXICILLIN 500 MG TABS (AMOXICILLIN) 2 tabs by mouth two times a day for sinus infection    Laboratory Results   Blood Tests   Date/Time Received: June 10, 2010 10:10 AM Date/Time Reported: June 10, 2010 10:10 AM  PT: 26.6 s   (Normal Range: 10.6-13.4)  INR: 2.2   (Normal Range: 0.88-1.12   Therap INR: 2.0-3.5)

## 2010-07-03 ENCOUNTER — Telehealth: Payer: Self-pay | Admitting: *Deleted

## 2010-07-03 DIAGNOSIS — I4891 Unspecified atrial fibrillation: Secondary | ICD-10-CM

## 2010-07-03 DIAGNOSIS — Z7901 Long term (current) use of anticoagulants: Secondary | ICD-10-CM

## 2010-07-03 DIAGNOSIS — Z5181 Encounter for therapeutic drug level monitoring: Secondary | ICD-10-CM

## 2010-07-03 NOTE — Telephone Encounter (Signed)
INR monitoring enrollment  

## 2010-07-09 ENCOUNTER — Ambulatory Visit (INDEPENDENT_AMBULATORY_CARE_PROVIDER_SITE_OTHER): Payer: Medicare Other | Admitting: Internal Medicine

## 2010-07-09 DIAGNOSIS — Z5181 Encounter for therapeutic drug level monitoring: Secondary | ICD-10-CM

## 2010-07-09 DIAGNOSIS — Z7901 Long term (current) use of anticoagulants: Secondary | ICD-10-CM

## 2010-07-09 DIAGNOSIS — I4891 Unspecified atrial fibrillation: Secondary | ICD-10-CM

## 2010-07-17 ENCOUNTER — Other Ambulatory Visit: Payer: Self-pay | Admitting: Internal Medicine

## 2010-08-06 ENCOUNTER — Ambulatory Visit (INDEPENDENT_AMBULATORY_CARE_PROVIDER_SITE_OTHER): Payer: Medicare Other | Admitting: Internal Medicine

## 2010-08-06 DIAGNOSIS — I4891 Unspecified atrial fibrillation: Secondary | ICD-10-CM

## 2010-08-06 DIAGNOSIS — Z7901 Long term (current) use of anticoagulants: Secondary | ICD-10-CM

## 2010-08-06 DIAGNOSIS — Z5181 Encounter for therapeutic drug level monitoring: Secondary | ICD-10-CM

## 2010-08-12 NOTE — Letter (Signed)
September 15, 2007    Karie Schwalbe, MD  [address] [place], [place] [address]   RE:  RUBERT, FREDIANI  MRN:  [id number]  /  DOB:  September 01, 1917   Dear Gerlene Burdock,   It was nice to talk with you regarding Jerral Mccauley.  As you know, he is  doing well.  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  He denies any ongoing complaints and feels well.  As we  discussed, he has an incidental thyroid cyst noted at the time of  carotid ultrasound.  He is virtually asymptomatic and notices no neck  findings.   His medications include,  1. Amaryl 2 mg one-half tablet daily.  2. Niaspan 500 mg daily.  3. Coumadin as directed.  4. Saw palmetto.  5. Vitamin C 1000 mg daily.  6. Enteric-coated aspirin 81 mg daily.  7. Norvasc 10 mg daily.  8. Crestor 10 mg daily.  9. Starlix 120 mg daily.  10.Z-Bec.   On physical, he is alert and oriented, in no distress.  The blood  pressure is 110/70.  The pulse is 61.  The carotid upstrokes are brisk.  I examined his neck carefully, and then personally, could not appreciate  any specific nodules.  The lung fields are clear to auscultation and  percussion.  The PMI is nondisplaced.   The electrocardiogram demonstrates sinus rhythm with nonspecific T-wave  flattening.   IMPRESSION:  The patient continues to do well.  He has had pacer  followup in early 2009.  His examination is currently unremarkable.  As  we discussed, I will leave further evaluation of this incidental thyroid  cyst to your discretion.   Thanks so much for allowing [place] to share in his care.    Sincerely,      [name]. Riley Kill, MD, [facility]  Electronically Signed    TDS/MedQ  DD: 09/15/2007  DT: 09/15/2007  Job #: [id number]

## 2010-08-12 NOTE — Assessment & Plan Note (Signed)
[facility]                            CARDIOLOGY OFFICE NOTE   NAME:Klug, KAAMIL MOREFIELD                       MRN:          [id number]  DATE:10/21/2006                            DOB:          07-18-1917    [name] is in for followup.  He is stable.  He did have one episode of  chest pain back in May, and did not take any nitroglycerin.  He feels  well in general.  Occasionally he will have heart skipping but he has  not had anything else since that time.  In general, he feels well.  He  says his life has turned out the way he had always hoped it would.   MEDICATIONS:  1. Amaryl 2 mg, one half daily.  2. Niaspan 5 mg daily.  3. Coumadin as directed.  4. Saw palmetto.  5. Vitamin C.  6. Enteric coated aspirin 81 mg daily.  7. Norvasc 10 mg daily.  8. Crestor 10 mg daily.  9. Starlix 120 mg one to two daily.  10.Z-Bec daily.   PHYSICAL EXAMINATION:  GENERAL:  He is alert and oriented in no  distress.  VITAL SIGNS:  Blood pressure is 120/60, pulse 60.  LUNGS:  Fields clear.  HEART:  No murmur.  EXTREMITIES:  Revealed no edema.   EKG reveals bradycardia with atrial pacing.  QRS complex is normal with  nonspecific minor T abnormality.   IMPRESSION:  1. Coronary disease, status post coronary bypass graft surgery.  2. Hypercholesterolemia on lipid-lowering therapy.  3. Recent episode of chest pain, did not sound particularly ischemic.   PLAN:  1. Return to clinic in 4-6 months.  2. Continue followup at [facility].     [name]. Riley Kill, MD, [facility]  Electronically Signed    TDS/MedQ  DD: 10/21/2006  DT: 10/21/2006  Job #: [id number]

## 2010-08-12 NOTE — Assessment & Plan Note (Signed)
[facility]                         ELECTROPHYSIOLOGY OFFICE NOTE   NAME:RICHSyris, Brookens                       MRN:          [id number]  DATE:04/09/2008                            DOB:          1918-01-29    [name] returned today for followup.  He is a very pleasant 77 year old  man with a history of symptomatic bradycardia and is status post  permanent pacemaker insertion.  He has dyslipidemia.  He has controlled  diabetes.  He had no specific complaints today.  He denies chest pain.  He denies shortness of breath.  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  Actually, note  that he also has coronary artery disease.  He has had no anginal  symptoms.   CURRENT MEDICATIONS:  1. Amaryl 2 mg half tablet daily.  2. Niaspan 500 a day.  3. Coumadin as directed.  4. Vitamin C.  5. Aspirin 81 a day.  6. Norvasc 10 a day.  7. Crestor 10 a day.  8. Starlix 120 one to two tablets daily.  9. Multiple vitamins.   PHYSICAL EXAMINATION:  GENERAL:  He is a pleasant, well-appearing  elderly man in no distress, who looks younger than stated age.  VITAL SIGNS:  The blood pressure today was 128/78, the pulse 62 and  regular, respirations were 18, and the weight was 159 pounds.  NECK:  No jugular vein distention.  LUNGS:  Clear bilaterally to auscultation.  No wheezes, rales, or  rhonchi are present.  There is no increased work of breathing.  CARDIOVASCULAR:  Regular rate and rhythm.  Normal S1 and S2.  ABDOMEN:  Soft and nontender.  EXTREMITIES:  No edema.   Interrogation of his pacemaker demonstrates a [employer].  The P-  waves were 2, the R-waves were 9, the impedance 470 in the A, 490 in the  V, the threshold of 0.8 at 0.4 in the A and 0.9 at 0.4 in the RV.  The  battery was at beginning of life.  There was one mode switch less than  0% of the time, he was 87% A paced.   IMPRESSION:  1. Symptomatic  bradycardia.  2. Paroxysmal atrial fibrillation.  3. Coumadin therapy.  4. Coronary artery disease, status post bypass surgery.   DISCUSSION:  Overall, Mr. Macpherson is stable.  His pacemaker is working  normally.  He has no anginal symptoms.  His blood pressure is well  controlled.  We will see the patient back for pacemaker followup in 1  year.      Doylene Canning. [name], MD  Electronically Signed    GWT/MedQ  DD: 04/09/2008  DT: 04/10/2008  Job #: [id number]   cc:   Karie Schwalbe, MD

## 2010-08-12 NOTE — Assessment & Plan Note (Signed)
[facility]                            CARDIOLOGY OFFICE NOTE   NAME:Stenberg, QUINT CHESTNUT                       MRN:          [id number]  DATE:03/02/2008                            DOB:          1918/01/20    Mr. Tant is in for followup.  In general, he has been stable.  He has  not been having any progressive symptoms.  He continues to sail his boat  without any difficulty.   Medications include:  1. Niaspan 100 mg daily.  2. Coumadin as directed.  3. Amaryl 2 mg one-half daily.  4. Saw palmetto.  5. Vitamin C.  6. Enteric-coated aspirin 81 mg daily.  7. Norvasc 10 mg daily  8. Crestor 10 mg daily.  9. Starlix 120 one to two tablets daily.   On physical, blood pressure is 104/62, pulse is 60.  The lungs fields  are clear.  Cardiac rhythm is regular currently.   The EKG reveals electronic atrial pacer.   IMPRESSION:  1. Atrial fibrillation, paroxysmal, on Coumadin anticoagulation.  2. Non-insulin-dependent diabetes.  3. Hypercholesterolemia.  4. Coronary artery disease status post coronary bypass graft surgery.   PLAN:  1. Return to clinic in 6 months.  2. Continue current medical regimen.     [name]. Riley Kill, MD, [facility]  Electronically Signed    TDS/MedQ  DD: 03/05/2008  DT: 03/05/2008  Job #: [id number]

## 2010-08-12 NOTE — Assessment & Plan Note (Signed)
[facility]                         ELECTROPHYSIOLOGY OFFICE NOTE   NAME:[name], [name] BOROWIAK                       MRN:          [id number]  DATE:01/12/2007                            DOB:          22-Nov-1917    [name] was seen in the clinic on January 12, 2007 for a follow-up  visit of his guidance model #1297 Kipp Brood.  Date of implant was April 09, 2006 for sick sinus syndrome.  On interrogation of his device today  his battery voltage is at beginning of life.  P-waves measure 3  millivolts with an atrial capture threshold of 0.7 volts at 0.4 msec and  an atrial lead impedence of 500 ohms.  R-waves measured greater than 12  millivolts with a ventricular pacing threshold of 0.6 volts at 0.4 msec  and a ventricular lead impedence of 540 ohms.  There were no episodes  recorded.  No changes were made in his parameters and he will be seen  back again in January in our [place] office.      Altha Harm, LPN  Electronically Signed      Doylene Canning. [name], MD  Electronically Signed   PO/MedQ  DD: 01/12/2007  DT: 01/13/2007  Job #: [id number]

## 2010-08-12 NOTE — Progress Notes (Signed)
[facility]                  [facility]' OFFICE NOTE   NAME:[name], [name]                       MRN:          [id number]  DATE:04/22/2007                            DOB:          01-23-1918    HISTORY OF PRESENT ILLNESS:  [name] returns today for pacemaker  follow-up.  He is very pleasant 77 year old male with a history of  symptomatic bradycardia and hypertension and coronary disease status  post bypass surgery in 1994.  He also has intermittent atrial  fibrillation.  He has been on Coumadin for this.  He underwent permanent  pacemaker insertion by Dr. Antoine Poche back in November and returns today  for follow-up.  The patient has been stable since his pacemaker implant.  He denies chest pain or shortness of breath.  He has gone back to his  fairly vigorous daily activity which include fishing on a regular basis  as well as work around the house.   MEDICATIONS:  1. Amaryl 2 mg half tablet daily.  2. Niaspan 500 daily.  3. Coumadin as directed.  4. Multiple vitamins.  5. Aspirin 81 a day.  6. Norvasc 10 daily.  7. Crestor 10 a day.  8. Starlix.  9. Multiple vitamins.   PHYSICAL EXAMINATION:  GENERAL:  He is a pleasant, well-appearing,  elderly man in no distress.  VITAL SIGNS:  Blood pressure today was 122/66, pulse 60 and regular,  respirations were 18, weight 158 pounds.  NECK:  Revealed no jugular distention.  LUNGS:  Clear bilaterally auscultation.  No wheezes, rales or rhonchi  are present.  No increased work of breathing.  CARDIAC:  Regular rate and rhythm.  Normal S1-S2.  Did not appreciate  any murmurs, rubs or gallops.  ABDOMEN:  Soft, nontender, nondistended.  There is no organomegaly.  Bowel sounds present.  No rebound or guarding was there.  EXTREMITIES:  Demonstrate no edema.   Interrogation of his pacemaker demonstrates a Guidant Insignia with P  and R waves of 3 and 11, respectively.  The impedance 440  in the A  and  520 in the V, threshold 0.7 and 0.4 in the atrium, and 0.6 and 0.4 in  the right ventricle.  Battery voltage was at beginning of life.  There  were no mode switching episodes.  He was 79% A paced, 10% V paced.   IMPRESSION:  1. Symptomatic bradycardia.  2. Status post pacemaker insertion.  3. Coronary disease status post bypass surgery.   DISCUSSION:  Overall, [name] is stable.  His pacemaker is working  normally, and will plan to see him back for pacemaker follow-up in one  year.  He is instructed to call us if he has any additional questions or  problems.     [name]. [name], MD  Electronically Signed    GWT/MedQ  DD: 04/22/2007  DT: 04/23/2007  Job #: [id number]

## 2010-08-12 NOTE — Assessment & Plan Note (Signed)
[facility]                            CARDIOLOGY OFFICE NOTE   NAME:[name], [name]                       MRN:          [id number]  DATE:02/28/2007                            DOB:          [birth year]    [name] is in for a followup.  He is stable, he is not having any chest  pain, he has been able to walk on a regular basis.  He denies any  ongoing symptoms.   MEDICATIONS:  1. Amaryl 2 mg one-half tablet daily.  2. Niaspan 500 mg daily.  3. Coumadin as directed.  4. Saw palmetto.  5. Vitamin C 1000 mg daily.  6. Enteric coated aspirin 81 mg daily.  7. Norvasc 10 mg daily.  8. Crestor 10 mg daily.  9. Starlix 120 one to two a day.  10.__________  vitamin.   PHYSICAL EXAMINATION:  He is alert and oriented, in no distress.  Appears to be in normal rhythm.  Blood pressure is 127/72, the pulse is  64.  LUNGS:  Fields are clear.  CARDIAC:  Rhythm is regular without significant murmur noted.   EKG reveals normal sinus rhythm and is otherwise unremarkable.  It is  difficult to tell if this sinus and/or an atrial paced rhythm although I  suspect it is sinus overriding the pacemaker.  His last pulse was 60 and  the pacing spikes were visible, so therefore it is likely native rhythm.   IMPRESSION:  1. Coronary disease, status post coronary bypass graft surgery.  2. Hypercholesterolemia on Lipitor therapy with last LDL at 88.  3. History of atrial fibrillation on Coumadin anticoagulation,      currently stable.  4. Non insulin-dependent diabetes mellitus.  5. Hyperlipidemia.   PLAN:  1. Lipid and liver profile in followup with Dr. Alphonsus Sias.  2. Return to Cardiology Clinic in 6 months.     [name]. Riley Kill, MD, [facility]  Electronically Signed    TDS/MedQ  DD: 02/28/2007  DT: 02/28/2007  Job #: [id number]

## 2010-08-15 NOTE — Discharge Summary (Signed)
[place]  Patient:    [name], [name]                         MRN: [id number] Adm. Date:  07/05/00 Disc. Date: 07/06/00 Attending:  Rosalyn Gess. Norins, M.D. [facility] CC:         Titus Dubin. Alwyn Ren, M.D. [facility]   Discharge Summary  ADMISSION DIAGNOSES: 1. Dehydration with orthostasis. 2. Possible prostatitis. 3. History of sick sinus syndrome and coronary artery disease. 4. Diabetes.  DISCHARGE DIAGNOSES: 1. Dehydration with orthostasis. 2. Possible prostatitis. 3. History of sick sinus syndrome and coronary artery disease. 4. Diabetes.  PROCEDURES:  None.  CONSULTANTS:  None.  HISTORY OF PRESENT ILLNESS:  The patient is an 77 year old gentleman followed by Titus Dubin. Alwyn Ren, M.D., for primary care in respect to cardiology.  the patient presented to the emergency department after having a 12-hour episode of significant nausea and vomiting which kept him up through the night.  He also had two bouts of diarrhea.  In the emergency department, the patient was significantly orthostatic.  He had presented with a blood pressure of 75/48. The patient remained orthostatic after 2 L of IV fluids with a blood pressure of 90/50 supine, 80/50 sitting, and 80/50 standing.  The patient also complained of lower abdominal pain and some low back pain. On examination, he had a tender prostate consistent with prostatitis.  The past medical history, family history, and social history are well documented in the chart.  ADMISSION PHYSICAL EXAMINATION:  Orthostatics as noted.  CHEST:  Clear.  CARDIOVASCULAR:  Unremarkable.  ABDOMEN:  Soft with positive bowel sounds with tenderness in all four quadrants, but worse in the lower quadrants.  RECTAL:  Tight sphincter tone with discomfort on exam.  The prostate was smooth, but very warm, but not boggy.  No nodules were appreciated.  HOSPITAL COURSE: #1 - DEHYDRATION WITH ORTHOSTASIS:  The patient was admitted to a  regular bed and was given IV fluids which he tolerated well.  The patients symptoms resolved in regards to nausea and vomiting.  He did have an appetite.  He has tolerated a full meal without difficulty.  The patients blood pressures were checked in the lying, sitting, and standing positions, which revealed a blood pressure of 126/49 with a pulse of 60, 120/52 with a pulse of 60, and standing 120/50 with a pulse of 54.  With the patients orthostasis being resolved, he was felt to be stable and ready for discharge.  #2 - GENITOURINARY:  Patient with a tender prostate on examination.  Symptoms consistent with possible prostatitis.  He was started on Cipro 500 mg b.i.d. The plan is for the patient is to continue on Cipro 500 mg b.i.d. for 10 days.  #3 - CARDIOVASCULAR:  The patient is stable.  He will continue on his home medications.  #4 - DIABETES:  The patient is now taking a diet and he will resume his regular medications.  DISCHARGE PHYSICAL EXAMINATION:  The temperature was 99.2 degrees, blood pressures and pulses as noted, respirations were 20, and the heart rate was 51.  HEENT:  Unremarkable.  CARDIOVASCULAR:  Regular rate and rhythm.  ABDOMEN:  Good positive bowel sounds.  No tenderness on examination.  The BMET on July 06, 2000, with a sodium of 137, potassium 3.7, BUN 19, creatinine 0.9, and glucose 134.  DISCHARGE MEDICATIONS:  The patient will continue all of his home medications. Will add Cipro 500  mg b.i.d. as noted.  DISPOSITION:  The patient is discharged home.  FOLLOW-UP:  He is to see Titus Dubin. Alwyn Ren, M.D., in follow-up in three to four weeks for a complete physical exam at his request.  CONDITION ON DISCHARGE:  The patients condition at the time of discharge dictation is stable and improved. DD:  07/06/00 TD:  07/06/00 Job: [id number]

## 2010-08-15 NOTE — H&P (Signed)
NAMESAINTCLAIR, SCHROADER                ACCOUNT NO.:  [id number]   MEDICAL RECORD NO.:  [id number]          PATIENT TYPE:  OIB   LOCATION:  2899                         FACILITY:  [facility]   PHYSICIAN:  [name]. Riley Kill, MD, FACCDATE OF BIRTH:  30-Nov-1917   DATE OF ADMISSION:  04/09/2006  DATE OF DISCHARGE:                              HISTORY & PHYSICAL   CHIEF COMPLAINT:  Dizziness.   HISTORY OF PRESENT ILLNESS:  Mr. Levitz is an 77 year old gentleman well  known to me.  He has a history of coronary bypass graft surgery in 1994  and paroxysmal atrial fibrillation with some known bradycardia,  resulting in discontinuation of beta blockade.  He has recently  developed recurrent episodes of dizziness.  He was seen in emergency  room by Dr. Diona Browner.  There was no orthostatic change, and CT scan was  negative.  Holter monitor was obtained as an outpatient which  demonstrated repeated episodes of heart rates at times in the 30s.  This  was mostly at night, but he also had short runs of SVT.  With this, it  was felt that permanent pacing was indicated.  I took the case over to  Dr. Antoine Poche, who reviewed the findings and recommended permanent  pacemaking implantation.  He is now admitted for that particular  purpose.   PAST MEDICAL HISTORY:  Remarkable for coronary disease status post  coronary artery bypass graft surgery.  The patient has a history of sick  sinus syndrome.  He has non-insulin-dependent diabetes and paroxysmal  atrial fibrillation.  He has hypercholesterolemia, on lipid lowering  management, type 2 diabetes.  He has had a back cyst removed previously.   MEDICATIONS:  Include:  1. Crestor 10 mg daily.  2. Niaspan 500 mg p.o. daily.  3. Amaryl 1 mg daily.  4. Aspirin 81 mg daily.  5. Starlix 120 mg daily.  6. Vitamin C.  7. Saw palmetto.   FAMILY HISTORY:  His father died at 25 of an MI, and his mother had  Alzheimer's and diabetes.  He had one brother who died with  diabetes and  sister who died with diabetes as well, a sister who died of a fungal  infection.   The patient is retired.  He lives with his wife at [facility].  He walks  about 2 miles per day on a regular basis.   REVIEW OF SYSTEMS:  The patient has not had syncope or significant pre-  syncope.  He has not had significant blood in his stools and has been  feeling well. Overall, the review of symptoms is otherwise  noncontributory.   EXAMINATION:  GENERAL:  He is alert, oriented, in no acute distress.  VITAL SIGNS:  Weight 164, blood pressure 124/64, pulse 57.  There is no  change with supine or standing.  LUNGS:  Fields are clear to auscultation and percussion.  The PMI is not  displaced.  There is normal first and second heart sound without murmur  or significant rub.  The carotid upstrokes are reasonable.  No carotid  bruits appreciated.  NECK:  Supple.  HEENT:  Examination is unremarkable.  ABDOMEN:  Soft.  EXTREMITIES:  Reveal no edema.  NEUROLOGIC:  Exam was focally intact.   Electrocardiogram demonstrates sinus bradycardia, rate 57 with  occasional premature ventricular contractions.   Recent laboratory studies include hemoglobin of 14.3, hematocrit of 42,  platelet count 211.  The glucose is 125, BUN 14, creatinine 0.9.  Urinalysis was negative.   IMPRESSION:  1. Sick sinus syndrome with marked sinus bradycardia and history of      paroxysmal atrial fibrillation.  2. Dizziness probably secondary to #1.  3. Coronary artery disease status post coronary bypass graft surgery.  4. Hypercholesterolemia, on lipid lowering therapy.  5. Paroxysmal atrial fibrillation, on Coumadin anticoagulation.  6. Non-insulin-dependent diabetes mellitus.  7. Prior back cyst.   PLAN:  The case has been discussed and reviewed with Dr. Antoine Poche who  plans on permanent pacer implantation.  He will make the arrangements  for this.  The patient's Coumadin will be held, until he has this  done.      [name]. Riley Kill, MD, [facility]  Electronically Signed     TDS/MEDQ  D:  04/09/2006  T:  04/09/2006  Job:  [id number]

## 2010-08-15 NOTE — Consult Note (Signed)
NAMEJANZIEL, HOCKETT                ACCOUNT NO.:  [id number]   MEDICAL RECORD NO.:  [id number]          PATIENT TYPE:  EMS   LOCATION:  MAJO                         FACILITY:  [facility]   PHYSICIAN:  Jonelle Sidle, MD DATE OF BIRTH:  Mar 11, 1918   DATE OF CONSULTATION:  04/04/2006  DATE OF DISCHARGE:                                 CONSULTATION   PRIMARY CARDIOLOGIST:  Dr. Shawnie Pons.   REASON FOR CONSULTATION:  Dizziness.   HISTORY OF PRESENT ILLNESS:  Mr. Salva is a pleasant 77 year old  gentleman with a history of coronary artery disease status post remote  coronary artery bypass grafting in 1994, paroxysmal atrial fibrillation  as well as sick sinus syndrome on chronic Coumadin, type 2 diabetes  mellitus, and renal insufficiency.  He last saw Dr. Riley Kill in the  office back in July and was doing well at that time.  He resides in [place] with his wife and states that overall he has been doing very well,  although as of Saturday he began to experience some transient episodes  of dizziness.  The first event occurred after he had gotten up in the  morning.  He felt a feeling of lightheadedness and unsteadiness but did  not have frank syncope.  He noted no antecedent chest pain, dyspnea or  palpitations and these symptoms lasted for a few seconds.  Later in the  morning after he did some basic calisthenics on the floor, he stool up  and had similar symptoms that lasted for a few minutes.  He was  eventually seen by a nurse at his facility and he tells me that she  stated his glucose was 99 and that his vital signs were stable.  It was  suggested that he eat something and he states that he ate a peanut  butter cracker and felt somewhat better without recurrent symptoms for  the rest of the day.  This morning he had recurrent dizziness and was  referred to the emergency department.  On examination, he was not noted  to be orthostatic with a systolic blood pressure supine of  148,  increasing to 155 seated and 160 standing, with an increase in heart  rate from the 50's to the 70's and sinus rhythm noted on  electrocardiogram.  He had a CT scan of the head that was negative for  bleed or other acute intracranial process.  His baseline labs showed an  INR of 3, normal myoglobin of 50, normal troponin I less than 0.05, and  normal CK MB less than 1.  We were asked to evaluate him further.   ALLERGIES:  NO KNOWN DRUG ALLERGIES.   MEDICATIONS:  1. Crestor 10 mg p.o. daily.  2. Niaspan 500 mg p.o. daily.  3. Coumadin 2.5 mg on Tuesday, Thursday, Saturday, Sunday with 5 mg on      Monday, Wednesday, Friday.  4. Amaryl 1 mg p.o. daily.  5. Aspirin 81 mg p.o. daily.  6. Starlix 120 mg p.o. daily.  7. Vitamin C 1000 mg p.o. daily.  8. Saw Palmetto 1725 mg p.o.  daily.   PAST MEDICAL HISTORY:  Is as outlined above.  I note a history of sick  sinus syndrome.  The patient was apparently previously on beta-blockers,  although these were discontinued.  He has not required a pacemaker, has  had no frank syncope.  He does not report any typical problems with  rapid palpitations.  Additional history includes arthritis.   FAMILY HISTORY:  Was reviewed and is noncontributory.   SOCIAL HISTORY:  The patient lives at [facility] with his wife.  He is  independent and active.  Denies any active tobacco or alcohol use.   REVIEW OF SYSTEMS:  As described in the History of Present Illness.  Otherwise, he states that he has been feeling well.  He typically walks  2 miles a day without symptoms.   EXAMINATION:  Blood pressure is 169/71, heart rate is 65 and regular.  This is a normally nourished elderly male in no acute distress.  Denying  any active chest pain, shortness of breath or palpitations.  HEENT:  Conjunctiva was normal.  Oropharynx is clear.  NECK:  Supple without elevated jugular venous pressures, without bruits.  No thyromegaly is noted.  LUNGS:  Clear without  labored breathing at rest.  CARDIAC:  Reveals a regular rate and rhythm without loud murmur or S3  gallop.  ABDOMEN:  Soft, nontender, normoactive bowel sounds.  EXTREMITIES:  No significant pitting edema.  Distal pulses are 2+, skin  warm and dry.  MUSCULOSKELETAL:  No kyphosis noted.  NEURO/PSYCHIATRIC:  The patient is alert and oriented x3, no focal  deficits.  Affect is normal.   LABORATORY DATA:  Urinalysis is normal other than urine glucose of 100,  serum glucose 125, sodium 137, potassium 4, BUN 14, creatinine 0.9.  The  WBC is 5.8, hemoglobin 14.3, hematocrit 42, glucose 211.   IMPRESSION:  1. Recent episodes of dizziness without frank orthostasis.  It is      certainly possible that a bradyarrhythmia is responsible given his      history of sick sinus syndrome with paroxysmal atrial fibrillation,      although this is not clearly evident during his observation in the      emergency department.  His cardiac markers are reassuring,      electrocardiogram nonspecific, and he has had no chest pain or      dyspnea.  He has had no frank syncope.  Coumadin level is      therapeutic.  2. History of paroxysmal atrial fibrillation and sick sinus syndrome      as outlined.  3. Coronary artery disease status post coronary artery bypass grafting      in 1994.  4. Type 2 diabetes mellitus.  5. History of renal insufficiency.   RECOMMENDATIONS:  I discussed the situation with the patient and his  wife.  I offered either an observation admission over night for  telemetry monitoring vs. discharge with placement of a Holter Monitor  tomorrow through the office and subsequent followup as already scheduled  with Dr. Riley Kill on Tuesday.  They opted for the latter approach and we  will therefore arrange through the office for a 24 hour Holter to be  placed tomorrow (Monday) with subsequent followup on Tuesday with Dr. Riley Kill.  I have asked that if his symptoms progress in the meanwhile he   let us know or seek medical attention.      Jonelle Sidle, MD  Electronically Signed     SGM/MEDQ  D:  04/04/2006  T:  04/04/2006  Job:  [id number]

## 2010-08-15 NOTE — Consult Note (Signed)
[name], [name]                          ACCOUNT NO.:  [id number]   MEDICAL RECORD NO.:  [id number]                   PATIENT TYPE:  INP   LOCATION:  2901                                 FACILITY:  [facility]   PHYSICIAN:  Learta Codding, M.D.                 DATE OF BIRTH:  1917/10/30   DATE OF CONSULTATION:  09/26/2002  DATE OF DISCHARGE:                                   CONSULTATION   REPORT TITLE:  CARDIOLOGY CONSULTATION.   REFERRING PHYSICIAN:  Rosalyn Gess. Norins, M.D.   CARDIOLOGIST:  [name]. Riley Kill, M.D.   CHIEF COMPLAINT:  Rapid heart rate.   HISTORY OF PRESENT ILLNESS:  The patient is an 77 year old male with a  history of coronary artery disease, status post coronary artery bypass  grafting in 1994.  The patient was admitted on September 23, 2002, with a febrile  illness.  It was felt that the patient had prostatitis.  He did develop some  hypotension requiring IV fluids.  Of note was that he was admitted in normal  sinus rhythm.  He does have a history of sick sinus syndrome, however, and  atrial fibrillation, and has been on chronic Coumadin therapy.   The patient was admitted for further management of presumed prostatitis;  however, two days ago it was noted that the patient's heart rate was rapid,  with rates up to 145 bpm.  A consultation is now being requested for further  diagnosis and management of this patient's tachycardia.  The patient  remained febrile with temperatures up to 101 degrees.  He denies, however,  any substernal chest pain.  He has no shortness of breath.  He reports no  palpitations, and he is well aware of rapid atrial fibrillation.  This has  happened many times in the past.  He can tell when he goes into atrial  fibrillation.  His electrocardiogram in the office does not demonstrate any  evidence of ischemia.  Cardiac Troponins are pending.  He has received  labetalol without a significant rate lowering response.   ALLERGIES:  No known  drug allergies.   PAST MEDICAL HISTORY:  1. History of sick sinus syndrome.  2. Atrial fibrillation, on chronic anticoagulation.  3. History of coronary artery disease with a six-vessel bypass in 1994.  4. History of adult onset diabetes mellitus.  5. History of arthritis.  6. History of mild chronic renal insufficiency.   FAMILY HISTORY:  Noncontributory.   SOCIAL HISTORY:  The patient lives with his wife.  They are independent.  The patient is retired.   ADMISSION MEDICATIONS:  1. Vitamin C.  2. Aspirin.  3. Norvasc.  4. Amaryl.  5. Lipitor.  6. Coumadin.  7. Celebrex.   REVIEW OF SYSTEMS:  As per the HPI.  No nausea, vomiting.  Positive for  fever and chills.  No abdominal pain.  No substernal chest pain.  No  shortness of breath.  No palpitations or syncope.   PHYSICAL EXAMINATION:  VITAL SIGNS:  Blood pressure 101/70, heart rate 125  bpm.  GENERAL:  This is a somewhat pale-appearing white male, in no apparent  distress.  HEENT:  Pupils isocoric.  NECK:  Supple, normal carotid upstroke.  LUNGS:  Clear.  HEART:  Irregular rate and rhythm.  Normal S1 and S2, no S3.  ABDOMEN:  Soft.  EXTREMITIES:  With 3+ pulses.  No clubbing, cyanosis, or edema.   LABORATORY DATA:  Hemoglobin 12.9, hematocrit 38.3, platelet count 73, white  count 4.2.  INR 1.6.  Sodium 136, potassium 3.9, chloride 106, glucose 216.  Troponin 0.03.  Calcium 8.0.   EKG:  Apical fibrillation with left ventricular response and nonspecific ST-  T wave changes.   Chest x-ray:  Mild vascular congestion but no effusions.   IMPRESSION/PLAN:  1. Apical fibrillation with left ventricular response.  The patient has a     history of sick sinus syndrome.  Has converted back to atrial     fibrillation.  2. His blood pressure is borderline, but he appears to be volume depleted:     Will resuscitate him and rate control established with beta-blocker     therapy.  Will follow his status.  We will then proceed  with a     cardioversion.  His INR is low at 1.6.  The patient has had atrial     fibrillation for less than 48 hours.  Intravenous heparin will be     initiated in the interim.  Lopressor 5 mg has been started.  3. Hypotension likely secondary to a rapid heart rate and superimposed     volume depletion.  Will be treated with intravenous fluids,  __________     will also be obtained.  4. Anticoagulation.  Will continue with Coumadin for now.  Will cover him in     the interim with heparin given in the event an emergent cardioversion is     needed.                                               Learta Codding, M.D.    GED/MEDQ  D:  09/26/2002  T:  09/26/2002  Job:  [id number]   cc:   Rosalyn Gess. Norins, M.D. [facility]   Maisie Fus D. Riley Kill, M.D.

## 2010-08-15 NOTE — Assessment & Plan Note (Signed)
[facility]                              CARDIOLOGY OFFICE NOTE   NAME:[name], [name]                       MRN:          [id number]  DATE:10/20/2005                            DOB:          11-May-1917    Mr. Spilman is in for a followup visit.  He really is doing quite well.  He  still gets up early in the morning.  Yesterday, he went fishing.  He denies  any ongoing chest pain or significant shortness of breath.  Recent lipid  profile revealed an LDL of 78 with an HDL of 50.6 and triglycerides of 53 on  Crestor.   PHYSICAL EXAMINATION:  GENERAL:  On exam today, he is an alert and oriented  gentleman.  He is 77 years of age.  VITAL SIGNS:  Blood pressure is 139/68, weight is 159 and the pulse is 49.  LUNGS:  The lung fields are clear, and the cardiac rhythm is regular.  EXTREMITIES:  No significant edema.   IMPRESSION:  1.  Coronary artery disease, status post coronary artery bypass graft      surgery.  2.  Continued antiplatelet therapy for coronary artery disease.  3.  Sinus bradycardia with beta blockers held.  4.  Noninsulin-dependent diabetes mellitus.  5.  Paroxysmal atrial fibrillation.  6.  Hypercholesterolemia.  7.  Type 2 diabetes mellitus.   PLAN:  1.  Continue current medical regimen.  2.  Return to clinic in 6 months.   Electrocardiogram today reveals marked sinus bradycardia; otherwise,  unremarkable.                              [name]. Riley Kill, MD, [facility]    TDS/MedQ  DD:  10/20/2005  DT:  10/20/2005  Job #:  [id number]

## 2010-08-15 NOTE — Assessment & Plan Note (Signed)
[facility]                            CARDIOLOGY OFFICE NOTE   NAME:Godino, DMAURI ROSENOW                       MRN:          [id number]  DATE:07/01/2006                            DOB:          [birth year]    Mr. Budney is in today for followup.  In general, he has been stable.  He  has had a little bit of dizziness and this has condition to be similar  to what he had prior to the pacemaker implantation, but he feels well.   Today on exam, the blood pressure is 144/71 and does not change, pulse  is 60.  The lung fields are clear and the cardiac rhythm is regular.  His pacer site looks good.   His EKG reveals atrial pacing with ventricular tracking.   IMPRESSION:  1. Coronary artery disease status post coronary artery bypass graft      surgery.  2. Hypercholesterolemia.  3. Sick sinus syndrome status post implantation of permanent      transvenous pacer.  4. History of atrial fibrillation on Coumadin anticoagulation.   PLAN:  1. Return to clinic in 4 months.  2. Continue current medical regimen.   ADDITIONAL DIAGNOSIS:  Includes diabetes type 2.     [name]. Riley Kill, MD, [facility]  Electronically Signed    TDS/MedQ  DD: 07/01/2006  DT: 07/01/2006  Job #: [id number]

## 2010-08-15 NOTE — Discharge Summary (Signed)
[name], [name]                          ACCOUNT NO.:  [id number]   MEDICAL RECORD NO.:  [id number]                   PATIENT TYPE:  INP   LOCATION:  2037                                 FACILITY:  [facility]   PHYSICIAN:  Gordy Savers, M.D. [facility]      DATE OF BIRTH:  12/12/1917   DATE OF ADMISSION:  09/23/2002  DATE OF DISCHARGE:                                 DISCHARGE SUMMARY   FINAL DIAGNOSIS:  Intractable nausea and vomiting.   ADDITIONAL DIAGNOSES:  1. Drug eruption.  2. Prostatitis.  3. Paroxysmal atrial fibrillation.  4. Heparin-induced thrombocytopenia.  5. Type 2 diabetes.  6. Chronic anticoagulation.  7. Respiratory insufficiency.   DISCHARGE MEDICATIONS:  1. Cipro 500 mg b.i.d. for 10 additional days.  2. Toprol-XL 25 mg daily.  3. Amaryl 2 mg daily.  4. Lipitor 10 mg daily.  5. Celebrex 200 mg daily.  6. Aspirin 81 mg daily.  7. Toprol-XL 25 mg daily.   The patient's Coumadin will be held until office followup in three days.   HISTORY OF PRESENT ILLNESS:  The patient is an 77 year old gentleman who  presented with nausea and vomiting.  These were refractory to outpatient  management and associated with progressive weakness and also diarrhea.  He  was brought to the emergency department via EMS and was hypotensive at the  time of evaluation.  He was treated with aggressive IV fluids, but remained  orthostatic and was subsequently admitted for further evaluation and  treatment.   PAST MEDICAL HISTORY:  The patient has a history of sick sinus syndrome,  coronary artery disease, status post six-vessel CABG in 1994.  Other  problems include type 2 diabetes, BPH as well as DJD.   LABORATORY DATA AND HOSPITAL COURSE:  The patient was admitted to the  hospital, where he was seen in consultation by cardiology.  During the  hospital period, he had a history of paroxysmal atrial fibrillation, but at  the time of discharge, was in normal sinus rhythm,  controlled on beta  blocker therapy.  Due to fever, he was placed on Cipro and at the time of  discharge, his Coumadin was held due to some Coumadin-related increase in  his INR.  He was initially placed on Ceftin for suspected pneumonia, which  caused a severe drug dermatitis; this was discontinued and at the time of  discharge, this was improving.  It was felt that his fever was related to  his prostatitis.  His diabetes and hypertension remained stable during the  hospital period.  During the course, the patient had some respiratory  insufficiency with some mild hypoxemia; this was treated with careful  diuresis and pulmonary toilet and the patient improved.  At the time of  discharge, he was off oxygen, ambulatory without difficulty and was  afebrile.  Blood sugars were carefully monitored.  His hospital course was  marked by steady improvement, but was complicated by heparin-induced  thrombocytopenia.  Total platelet count reached a low of 73,000 but improved  with discontinuation of heparin.  Blood cultures were negative.  White count  remained normal.  During the hospital period, serial INRs were monitored.  On admission, he had some mild azotemia and hyponatremia that has improved  with careful rehydration.  Chest x-ray revealed cardiomegaly and no active  lung disease.  Electrocardiogram revealed atrial flutter with a 4:1 block,  left axis deviation and nonspecific ST-T wave abnormalities.   DISPOSITION:  The patient was discharged today on the medicines listed  above.  Coumadin will be held pending outpatient reevaluation.  He will be  discharged to complete 10 extra days of Cipro.   CONDITION ON DISCHARGE:  Improved.                                               Gordy Savers, M.D. [facility]    PFK/MEDQ  D:  09/30/2002  T:  10/02/2002  Job:  [id number]   cc:   Titus Dubin. Alwyn Ren, M.D. [facility]    cc:   Titus Dubin. Alwyn Ren, M.D. [facility]

## 2010-08-15 NOTE — H&P (Signed)
[place]  Patient:    [name], [name]                       MRN: [id number] Adm. Date:  [id number] Attending:  Duke Salvia CC:         Titus Dubin. Alwyn Ren, M.D. [facility], [facility] [place] [facility]   History and Physical  CHIEF COMPLAINT:  Nausea and vomiting.  HISTORY OF PRESENT ILLNESS:  The patient is a pleasant 77 year old gentleman followed by Dr. Titus Dubin. Hopper for primary care, Dr. [name]. Stuckey for cardiology.  Patient reports that he ate two salmon cakes and some frozen waffles the day prior to admission.  That night, he started feeling hot water brash and reflux at about 10:30 p.m.  The patient then started having nausea and vomiting starting at 10:30 p.m. which kept him up through the night with multiple episodes.  He also had diarrhea x 2.  The patient began to feel very weak and light-headed.  Because of his weakness, EMS was called and he was found to be significantly hypotensive at the time and was transported to [facility].  His blood pressure at that time was 75/48, with a capillary blood glucose of 156.  In the emergency department, the patient was given 1 L of lactated Ringers and 12.5 mg of IV Phenergan with some improvement in his symptoms.  However, the patient continued to be orthostatic which, by my exam, revealed at 1300 hours a supine blood pressure of 90/50, sitting blood pressure 80/50, standing blood pressure 80/50.  After a second liter of fluid at 1420 hours, the patients blood pressure was 90/50 supine, 90/50 sitting, but 80/50 standing.  Because of his persistent hypotension and orthostasis, the patient is now admitted for further IV hydration and monitoring.  PAST MEDICAL HISTORY:  Patient had a sick sinus syndrome but no pacemaker.  He had coronary artery disease with six-vessel bypass surgery in 1994.  He is followed for adult-onset diabetes, BPH and significant arthritis.  CURRENT  MEDICATIONS AT ADMISSION: 1. Coumadin 2.5 mg Tuesday, Thursday, Saturday and Sunday; 5 mg Monday,    Wednesday, Friday. 2. Amaryl 2 mg daily. 3. Celebrex 200 mg daily. 4. Norvasc 10 mg daily. 5. Lipitor 10 mg daily. 6. Aspirin 81 mg daily. 7. Multivitamin daily. 8. Vitamin C daily. 9. Saw palmetto 2000 mg daily.  FAMILY HISTORY:  Family history is noncontributory in an 77 year old.  SOCIAL HISTORY:  Patient lives with his wife.  They are independent.  Patient is retired.  ADMITTING PHYSICAL EXAMINATION:  VITAL SIGNS:  Patient is afebrile.  Orthostatic vital signs as noted in the HPI.  GENERAL APPEARANCE:  This is a pleasant, elderly gentleman who looks his stated age.  He is in no acute distress but does feel bad.  HEENT:  Normocephalic, atraumatic.  Oropharynx without lesion but dry mucous membranes are noted.  Conjunctivae and sclerae are clear.  Pupils equal, round and reactive to light and accommodation.  NECK:  Neck was supple.  There was no JVD.  NODES:  No adenopathy was noted in the cervical or supraclavicular regions.  CHEST:  No CVA tenderness.  Lungs were clear with no rales, wheezes or rhonchi as of 1430 hours.  CARDIOVASCULAR:  Radial pulse 2+.  No JVD or carotid bruits.  His precordium was quiet, his heart with a regular rate and rhythm with no murmurs, rubs, or gallops to my exam.  ABDOMEN:  Abdomen  soft with positive bowel sounds in all four quadrants. Patient has tenderness to palpation in the lower quadrant.  Patient had tenderness to percussion over both flanks.  BACK:  He had mild tenderness to percussion over the costovertebral angle in the lumbar spine.  RECTAL:  Patient has tight sphincter tone and had discomfort with the exam. Prostate was smooth, very warm but not boggy.  No nodules were appreciated.  EXTREMITIES:  Without clubbing, cyanosis, edema or deformity.  DATA BASE:  Twelve-lead electrocardiogram revealed a sinus bradycardia at  a rate of 58 with no other abnormalities noted.  INR was 2.5 and therapeutic.  CBC with a WBC of 9400 with 88% segs, 7% lymphs, 5% monos, hemoglobin 16.4, hematocrit 48.4.  CK-MB was 1.5.  Troponin I was 0.01.  Chemistries with a sodium of 136, potassium 4.8, chloride 103, CO2 of 23, BUN of 26, creatinine 1.2.  Glucose was 185.  Liver functions were normal.  ASSESSMENT AND PLAN: 1. Nausea, vomiting and dehydration.  Patient has persistent orthostasis with    significant signs of dehydration.  Etiology is food poisoning versus viral    gastroenteritis versus possible prostatitis.  Plan:  Patient is to be    admitted.  We will give him intravenous antiemetics.  We will continue    intravenous fluids using normal saline at 75 cc/hr with careful attention    for potential fluid overload. 2. Infectious disease -- the patient with possible prostatitis.  His    examination is equivocal.  His white count is normal.  Plan:  Cipro 500 mg    b.i.d. and he will need to continue this for a minimum of 10 days. 3. Cardiovascular -- patient with a history of sick sinus syndrome and    coronary artery disease, on Coumadin.  Currently, he seems to be cardiac    stable.  Telemetry is not indicated at this time. 4. Diabetes.  Patient on Amaryl at home with reasonably good control by his    report.  While he is not taking p.o. intake, would not want to continue    long-acting ______ .  Plan:  Will follow a sliding-scale Humalog with no    nighttime coverage. DD:  07/05/00 TD:  07/05/00 Job: [id number]

## 2010-08-15 NOTE — Assessment & Plan Note (Signed)
[facility]                            CARDIOLOGY OFFICE NOTE   NAME:[name], [name]                       MRN:          [id number]  DATE:07/15/2006                            DOB:          1917-12-07    CARDIOLOGIST:  Dr. Riley Kill.   PRIMARY:  Dr. Alphonsus Sias.   REASON FOR PRESENTATION:  evaluate the patient with sick sinus syndrome  and paroxysmal atrial fibrillation.   HISTORY OF PRESENT ILLNESS:  The patient is a very pleasant 77 year old  gentleman with the above problems.  He had a pacemaker placed on January  11.  He has had no new complications since that time.  He rarely gets  dizzy.  He is able to walk 2 miles a day without any complaints.  He  denies any chest pain or shortness of breath.  He has had no  palpitations, pre-syncope, or syncope.  He has had no pain or problems  with his pacemaker incision.   PAST MEDICAL HISTORY:  Coronary artery disease status post CABG, sick  sinus syndrome, non-insulin-dependent diabetes mellitus, paroxysmal  atrial fibrillation, hyperlipidemia.   ALLERGIES:  HEPARIN, CEFTIN.   MEDICATIONS:  1. Amaryl 1 mg daily.  2. Niaspan 500 mg daily.  3. Coumadin.  4. Saw palmetto.  5. Vitamin C.  6. Aspirin 81 mg daily.  7. Norvasc 10 mg daily.  8. Crestor 10 mg daily.  9. Starlix 120 one to 2 daily.  10.Z-beta.   REVIEW OF SYSTEMS:  As stated in the HPI, and otherwise negative for  other systems.   PHYSICAL EXAMINATION:  The patient is in no distress.  Blood pressure 127/69, heart rate 67 and regular, weight 163 pounds.  NECK:  No jugular venous distension, wave form within normal limits,  carotid upstroke brisk and symmetric, no bruits, thyromegaly.  LYMPHATICS:  No adenopathy.  LUNGS:  Clear to auscultation bilaterally.  CHEST:  Well-healed sternotomy scar and well-healed pacemaker pocket.  HEART:  PMI not displaced or sustained, S1 and S2 within normal limits,  no S3, no S4, no clicks, rubs,  murmurs.  ABDOMEN:  Flat, positive bowel sounds, normal in frequency and pitch, no  bruits, rebound, guarding.  No midline pulsatile masses, hepatomegaly,  splenomegaly.  SKIN:  No rashes, no nodules.  EXTREMITIES:  With 2+, no edema.   PACEMAKER INTERROGATION:  The atrial amplitude is 3.4, impedence 480,  threshold 0.7 at 0.4.  Right ventricular amplitude 12, impedence 580,  threshold 0.6 at 0.4.  He is V-pacing only 14% of the time.  He stays in  a DDDR mode.  He has dynamic AV delay at 280/170.  We did reprogram  decreasing the atrial threshold to 2.0 at 0.4 and the RV to 2.5 at 0.4.   ASSESSMENT AND PLAN:  Sick sinus syndrome with paroxysmal atrial  fibrillation.  The patient is doing well.  He will have another  pacemaker followup in January.  At that time, we will teach him how to  use his home monitor.     Rollene Rotunda, MD, [facility]  Electronically Signed    JH/MedQ  DD: 07/15/2006  DT: 07/15/2006  Job #: [id number]   cc:   Karie Schwalbe, MD

## 2010-08-15 NOTE — H&P (Signed)
[name], [name]                          ACCOUNT NO.:  [id number]   MEDICAL RECORD NO.:  [id number]                   PATIENT TYPE:  EMS   LOCATION:  MAJO                                 FACILITY:  [facility]   PHYSICIAN:  Rosalyn Gess. Norins, M.D. [facility]         DATE OF BIRTH:  10/20/1917   DATE OF ADMISSION:  09/23/2002  DATE OF DISCHARGE:                                HISTORY & PHYSICAL   CHIEF COMPLAINT:  Fever and dysuria.   HISTORY OF PRESENT ILLNESS:  The patient is an 77 year old married white  male who reports the onset Wednesday of urinary frequency, rigors and fever.  He continued to feel ill over the next several days with persistent  intermittent fevers and progressive weakness.  He presented to the emergency  department and was found to have a temperature of 101.3.  He was hypotensive  initially at 94/49.  Over the course of his ER evaluation, his pressure  dropped to 89/52 despite IV fluids.   PHYSICAL EXAMINATION:  GENERAL:  On examination, he is found to have a warm,  boggy, tender prostate.  He is now admitted for p.o. ciprofloxacin and IV  fluids for prostatitis with impending sepsis.   PAST MEDICAL HISTORY:  The patient has a history of six vessel bypass  surgery in 1994.   MEDICAL ILLNESSES:  The patient has sick sinus syndrome, atrial  fibrillation.  Medically managed and on anticoagulation.  He has non-insulin-  dependent diabetes, history of BPH, history of significant arthritis.   CURRENT MEDICATIONS:  1. Vitamin C.  2. Aspirin 81 mg a day.  3. Norvasc 5 mg daily.  4. Amaryl 1 mg daily.  5. Lipitor 10 mg daily.  6. Coumadin 5 mg on Monday, Wednesday and Friday, 2.5 mg on Tuesday,     Thursday, Saturday and Sunday.  7. Celebrex 200 mg daily.  8. Niaspan 500 mg daily.  9. Saw palmetto.   FAMILY HISTORY:  Noncontributory.   SOCIAL HISTORY:  The patient is very independent, retired and lives with his  wife who manages all of his activities of daily  living including driving.   HEALTH MAINTENANCE:  The patient sees Dr. Alwyn Ren on a regular basis, and  recently was seen in the office several weeks ago, and did have full  laboratory evaluation.  The patient is also followed by Dr. [name].  Stuckey, and up to date with Dr. Riley Kill.   PHYSICAL EXAMINATION:  VITAL SIGNS:  Temperature 101.5, blood pressure  89/52, heart rate 64, respirations 16.  GENERAL:  The patient is a pleasant, elderly, Caucasian male in no acute  distress.  HEENT:  Normocephalic, atraumatic, unremarkable.  NECK:  Supple without thyromegaly.  No adenopathy is noted in the cervical,  supraclavicular or inguinal regions.  CHEST:  Clear to auscultation and percussion.  CARDIOVASCULAR:  There is 2+ radial pulses, JVD or carotid bruits, had a  quiet precordium with  a regular rate and rhythm without No murmurs rubs, or  gallops.  ABDOMEN:  Soft, no guarding or rebound.  No organomegaly or splenomegaly was  noted.  RECTAL:  Exam revealed normal sphincter tone.  Prostate was normal in size,  but warm, boggy, and tender to palpation.  EXTREMITIES:  Without clubbing, cyanosis or edema.  NEUROLOGIC:  Exam is nonfocal.   LABORATORY DATA:  Hemoglobin 14.3, hematocrit 41.4, white count 6500 with  92% segs, 5%  lymphocytes, 3% monocytes.  Platelet count 122,000.  Chemistries reveal sodium of 132, potassium 3.9, chloride 102, CO2 22, BUN  30, creatinine 1.4.  Glucose 149.  LFT's were normal.  INR is 2.5.  UA was  negative.  CK was 68.  A 12-view electrocardiogram revealed a sinus  bradycardia with no other abnormalities noted.   ASSESSMENT/PLAN:  1. ID:  The patient with fever, hypotension, tender prostate, all consistent     with prostatitis with impending sepsis.  Plan:  The patient is admitted     to a regular bed.  We will treat with Ciprofloxacin 500 mg p.o. b.i.d.     We will continue IV fluids.  We will monitor vital signs.  The patient     will be ready for discharge  when his blood pressures return to normal,     120's/80's.  2. Cardiovascular:  The patient with a history of CAD, status post bypass     surgery, history of atrial fibrillation.  He is currently stable with no     cardiac complaints.  Plan:  We will continue his home medications.  3. Diabetes.  The patient's serum glucose was 149.  The patient was seen in     the office recently, and I assume has an A1c.  We will not repeat that.  Plan:  The patient will be continued on Amaryl and will have sliding scale  coverage given his illness.  1. Hyperlipidemia.  The patient has been on Lipitor.  We will continue his     present medications.  I assume he had a recent lipid panel in the office.                                               Rosalyn Gess Norins, M.D. [facility]    MEN/MEDQ  D:  09/23/2002  T:  09/24/2002  Job:  [id number]   cc:   Titus Dubin. Alwyn Ren, M.D. [facility]   [facility]    cc:   Titus Dubin. Alwyn Ren, M.D. [facility]   [facility]

## 2010-08-15 NOTE — Assessment & Plan Note (Signed)
[facility]                                 ON-CALL NOTE   NAME:[name], [name]                       MRN:          [id number]  DATE:04/04/2006                            DOB:          10/10/1917    TELEPHONE NUMBER:  [phone].   TIME OF CALL:  5:37am.   He calls complaining of high blood pressure and dizziness. The patient  states that he woke up yesterday dizzy, but went about his daily  activities. After getting up at 3:30 in the morning which is normal for  him and he was able to do his daily activities with being dizzy and then  have the nurse at [facility] come check his blood pressure which was  good for him. She recommended he eat something and he felt better after  that, but then he got dizzy again and remained dizzy all night. This  morning he woke up again dizzy and did eat something, but remains dizzy  and states his blood pressure is 166/66 this morning and he just does  not feel good.   MEDICATIONS:  Norvasc, Coumadin, Amaryl, aspirin, multivitamin, vitamin  C, saw palmetto, Niaspan, Crestor, and Starlix as needed.   I recommend that the patient go to an urgent care emergency room to be  evaluated since he continues to feel bad. The patient agreed and said he  would go to the emergency room this morning.     Lelon Perla, DO  Electronically Signed    Shawnie Dapper  DD: 04/04/2006  DT: 04/04/2006  Job #: [id number]   cc:   Karie Schwalbe, MD

## 2010-08-15 NOTE — Op Note (Signed)
[name], [name]                ACCOUNT NO.:  [id number]   MEDICAL RECORD NO.:  [id number]          PATIENT TYPE:  OIB   LOCATION:  3714                         FACILITY:  [facility]   PHYSICIAN:  Rollene Rotunda, MD, FACCDATE OF BIRTH:  Mar 21, 1918   DATE OF PROCEDURE:  04/09/2006  DATE OF DISCHARGE:                               OPERATIVE REPORT   PROCEDURE:  Dual chamber permanent pacemaker placement.   PREOPERATIVE DIAGNOSIS:  Sick sinus syndrome/sinus node dysfunction.   POSTOPERATIVE DIAGNOSIS:  Sick sinus syndrome/sinus node dysfunction.   PROCEDURE NOTE:  The patient signed appropriate informed consent after  discussing all possible complications including but not comprehensive of  stroke, heart attack, death, myocardial infarction, hemothorax,  hemopericardium, pneumothorax, infection, and hematoma.  The patient  understood and agreed to proceed.  He was brought to the catheterization  lab where the left subclavian region was prepped and draped in a sterile  fashion.  A 7 cm incision was created under the subclavian area and,  using blunt dissection only, the incision was carried down to the  prepectoral fascia.  I did breach the prepectoral fascia uncovering the  pectoral muscle.  Under fluoroscopic guidance, using an anterior wall  puncture, the subclavian vein was easily cannulated with two separate  sticks for two separate wires.  These wires were inserted via modified  Seldinger, these wires were inserted through the puncture needle.  There  were no complications with this part of the procedure.  I used two safe  7-French sheaths inserted via modified Seldinger technique.  I retained  one sheath at a time.   Attention was first turned to inserting the ventricular lead.  This was  a Medtronic active fixation 5076, 52 cm lead, serial number [id number].  Under fluoroscopic guidance, this was inserted in the right ventricular  apex.  It was screwed into the myocardium.  The  R waves measured 8.4 mV.  Impedance was 859 ohms. The threshold was 0.6 volts at 0.5 milliseconds.  There was no diaphragmatic stimulation at 10 volts.  Attention was  turned to the atrial lead which was, again, inserted under fluoroscopic  guidance into the right atrium.  This was screwed into the myocardium  and the P-waves measured 2.2 mV.  Impedance 534 ohms. Threshold was 1.0  volts at 0.5 milliseconds.  There was no diaphragmatic stimulation at 10  volts.  The lead was a 5076, 45 cm, serial number [id number], Medtronic  active fixation lead.   The pocket was then inspected for hemostasis.  I did use Avitene for  hemostasis.  The pocket was copiously irrigated with Kanamycin solution.  The leads were then attached to the generator which was a [employer] DR, model [id number], serial number [id number].  The  leads were inserted into the generator.  The generator was inserted in  the pocket and fixed to the prepectoral fascia with a pop-off silk  suture.  The pocket was again inspected for hemostasis.  There was some  very slight generalized oozing but no active arterial bleeding sites.  The pocket was then closed in  three layers.  I used 2-0 Vicryl, first in  a running vertical mattress, second layer was the same Vicryl  in a 2-0 horizontal mattress.  The final subcutaneous stitch was a 4-0  Vicryl in a running horizontal mattress.  The skin was prepped and  draped with Benzoin and Steri-Strips.  A pressure dressing was applied.  The patient was left the lab in stable condition without apparent  complications.      Rollene Rotunda, MD, [facility]  Electronically Signed     JH/MEDQ  D:  04/09/2006  T:  04/09/2006  Job:  [id number]   cc:   Karie Schwalbe, MD  [name]. Riley Kill, MD, [facility]

## 2010-08-15 NOTE — Assessment & Plan Note (Signed)
[facility]                         ELECTROPHYSIOLOGY OFFICE NOTE   NAME:Bourcier, RAGE BEEVER                       MRN:          [id number]  DATE:04/26/2006                            DOB:          [birth year]    Mr. Heinicke was seen today in the clinic on April 26, 2006, for a wound  check of his newly-implanted Guidant model No. 1297 Insignia.  Date of  implant was April 09, 2006, for sick sinus syndrome.  On interrogation  of is device today, his battery voltage was beginning of life.  P-waves  measured 2.6 millivolts  with an atrial capture threshold of 0.6 volts  at 0.4 milliseconds, and an atrial lead impedence of 500 ohms.  R-waves  measured 12 millivolts  with a ventricular capture threshold of 0.4  volts at 0.4 milliseconds, and a ventricular lead impedence of 640 ohms.  There were no episodes since implant date, and no changes were made in  his parameters.  His Steri-Strips were removed today.  His wound was  without redness or edema, and he will follow up with Dr. [name] in 3  months' time.      Altha Harm, LPN  Electronically Signed      Doylene Canning. [name], MD  Electronically Signed   PO/MedQ  DD: 04/26/2006  DT: 04/26/2006  Job #: [id number]

## 2010-08-15 NOTE — Assessment & Plan Note (Signed)
[facility]                                 ON-CALL NOTE   NAME:RICHNaszir, [name]                       MRN:          [id number]  DATE:04/04/2006                            DOB:          06-03-1917    Mr. Koelzer called in saying that he was feeling lightheaded over the past  couple of days. He states that he took his blood pressure and heart rate  this morning and had a heart rate of 60 and a blood pressure of 160/80.  The patient stated that he wanted to come to the emergency department to  be evaluated, and I suggested that he come to [facility] if he chooses  to come to the emergency department.     Rod Holler, MD  Electronically Signed    TRK/MedQ  DD: 04/04/2006  DT: 04/04/2006  Job #: [id number]

## 2010-08-15 NOTE — Assessment & Plan Note (Signed)
[facility]                            CARDIOLOGY OFFICE NOTE   NAME:Feliciano, NANCY MANUELE                       MRN:          [id number]  DATE:05/04/2006                            DOB:          [birth year]    Mr. Broy was supposed to have an office visit today.  I ran into him in  the cafeteria about a half hour prior to his office visit, and he said  he had just been seen for a pacer check followup.  He said he is doing  extremely well and felt good.  He actually exposed his pacer site, which  looks well healed.  As a result, he wanted to not come to the office,  since he felt like he was doing extremely well.  We decided that he  would be seen in the office in a few weeks back in followup and for the  present time, he could hold off on a follow-up visit since he had come  into the device clinic just one week earlier.  He will call us if he has  any problems.     [name]. Riley Kill, MD, [facility]  Electronically Signed    TDS/MedQ  DD: 05/05/2006  DT: 05/05/2006  Job #: [id number]

## 2010-08-15 NOTE — Discharge Summary (Signed)
[name], [name]                ACCOUNT NO.:  [id number]   MEDICAL RECORD NO.:  [id number]          PATIENT TYPE:  OIB   LOCATION:  3714                         FACILITY:  [facility]   PHYSICIAN:  Doylene Canning. [name], MD    DATE OF BIRTH:  1917/09/01   DATE OF ADMISSION:  04/09/2006  DATE OF DISCHARGE:  04/11/2006                               DISCHARGE SUMMARY   PRIMARY CARDIOLOGIST:  Is Dr. Riley Kill.   DISCHARGING DIAGNOSIS:  Sick sinus syndrome with atrial field.  The  patient is status post permanent pacemaker with a [employer] DR all done on April 09, 2006 by Dr. Rollene Rotunda.   PAST MEDICAL HISTORY:  1. Includes coronary artery disease status post CABG.  2. Sick sinus syndrome currently symptomatic with dizziness prior to      pacemaker implantation.  3. Noninsulin-dependent diabetes.  4. Paroxysmal atrial fibrillation.  5. Hypercholesteremia.   HOSPITAL COURSE:  [name] is a very pleasant 77 year old Caucasian  gentleman followed by Dr. Riley Kill admitted for episode of dizziness in  the setting of history of PAF and sick sinus syndrome.  Dr. Riley Kill felt  the patient would benefit from permanent pacemaker implantation.  He  discussed this with Dr. Antoine Poche who agreed with plan.  EKG showed sinus  brady at a rate of 57 with occasional PVCs.  The patient admitted,  underwent procedure for dual-chamber permanent pacemaker placement on  April 09, 2006 by Dr. Antoine Poche.  The patient tolerated procedure  without complications.  Device interrogated postprocedure day one by  Baron Sane with [employer] on the 12th.  The patient was a  little unsteady on feet complaining of pain in his right knee.  Dr.  [name] felt the patient could benefit from another 24-hour observation  to monitor gait/activity.  Dr. [name] in to see the patient on day of  discharge.  The patient states he feels better.  He has a low-grade  temperature of 99.7 T-max, more steady on  his feet; however, complaining  of right knee being stiff and painful.  Chest x-ray showed no heart  failure but positive basilar atelectasis with right atrial appendage  lead intact and right ventricular lead intact.  The patient atrially  paced in the 70s.  Pacer site to left chest stable.  However, the  patient with right knee swollen and warm to touch.  The patient is being  discharged home with post pacemaker discharge instructions.  The patient  is to plan on calling Dr. Lequita Halt to have knee evaluated as a new  patient.  The patient instructed to resume his anticoagulation therapy  Coumadin and call for any further elevation in temperature.  At time of  discharge, the patient has been given the post pacemaker discharge  instructions.  He will call Dr. Lequita Halt for evaluation of right knee.  He is instructed to resume his previous medications to include Crestor  10 mg, Niaspan 500, Norvasc 10, Amaryl 1 mg, aspirin 81,  Coumadin as previously taken and Starlix 120 mg daily or as previously  instructed.  I have  given him a prescription for Tylenol with Codeine to  use as needed p.r.n. for discomfort.  At time of discharge, lab work PT  21.8 with INR 1.8.  Duration of discharge encounter is 45 minutes.      [name], ACNP      Doylene Canning. [name], MD  Electronically Signed    MB/MEDQ  D:  04/11/2006  T:  04/12/2006  Job:  [id number]   cc:   Karie Schwalbe, MD  Ollen Gross, M.D.

## 2010-08-15 NOTE — Assessment & Plan Note (Signed)
[facility]                            CARDIOLOGY OFFICE NOTE   NAME:[name], [name]                       MRN:          [id number]  DATE:04/06/2006                            DOB:          June 22, 1917    [name] is in for followup.  He was seen by Dr. Diona Browner for  consultation in the emergency room for dizziness.  The major finding at  that time was transient episodes of dizziness.  It almost sounded  orthostatic.  His glucose was checked and was adequate.  Dr. Diona Browner  checked his orthostatics and there was not a significant change.  A CT  was negative.  Troponins were negative, as well.  He subsequently has  had a Holter monitor placed.  The Holter monitor demonstrates some brief  episodes of SVT.  On top of that, the patient develops fairly profound  bradycardia with rates down into the 30s and 40s, off beta blocker  therapy.  At night, rates were as low as 32-33 beats per minute.  He has  been previously evaluated for this.   Today on examination, the blood pressure is 124/64, the pulse is 56.  The lung fields are clear and the cardiac rhythm is regular.   The electrocardiogram demonstrates fairly marked sinus bradycardia.  There are occasional premature ventricular contractions.   We will, at the present time, discuss the case with Dr. Antoine Poche.  I  have discussed the case with him in detail.  Based on the patient's  findings, it appears that the patient likely will need permanent pacing  for sick sinus syndrome.  He has had previous bradycardia documented,  back in July, but has never had any high-grade pauses of greater than 3  seconds noted.  He does have episodes of brief runs of SVT, intermixed  with fairly marked sinus bradycardia, and this is even noted on this  most recent Holter monitor.  Based on this, he will see Dr. Antoine Poche for  permanent pacer implantation.   Of note, his laboratories in the hospital were all reasonably  satisfactory.  Enzymes were negative.  Glucose was mildly elevated.  His  hemoglobin was 14.3.     [name]. Riley Kill, MD, [facility]  Electronically Signed    TDS/MedQ  DD: 04/09/2006  DT: 04/09/2006  Job #: [id number]

## 2010-09-03 ENCOUNTER — Ambulatory Visit (INDEPENDENT_AMBULATORY_CARE_PROVIDER_SITE_OTHER): Payer: Medicare Other | Admitting: Internal Medicine

## 2010-09-03 DIAGNOSIS — Z7901 Long term (current) use of anticoagulants: Secondary | ICD-10-CM

## 2010-09-03 DIAGNOSIS — I4891 Unspecified atrial fibrillation: Secondary | ICD-10-CM

## 2010-09-03 DIAGNOSIS — Z5181 Encounter for therapeutic drug level monitoring: Secondary | ICD-10-CM

## 2010-09-03 NOTE — Patient Instructions (Signed)
Continue current dose, check in 4 weeks  

## 2010-09-04 ENCOUNTER — Encounter: Payer: Self-pay | Admitting: Cardiology

## 2010-10-02 ENCOUNTER — Encounter: Payer: Self-pay | Admitting: Internal Medicine

## 2010-10-03 ENCOUNTER — Encounter: Payer: Self-pay | Admitting: Internal Medicine

## 2010-10-03 ENCOUNTER — Ambulatory Visit (INDEPENDENT_AMBULATORY_CARE_PROVIDER_SITE_OTHER): Payer: Medicare Other | Admitting: Internal Medicine

## 2010-10-03 VITALS — BP 148/60 | HR 63 | Temp 98.5°F | Ht 66.0 in | Wt 147.0 lb

## 2010-10-03 DIAGNOSIS — Z23 Encounter for immunization: Secondary | ICD-10-CM

## 2010-10-03 DIAGNOSIS — S60229A Contusion of unspecified hand, initial encounter: Secondary | ICD-10-CM | POA: Insufficient documentation
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
## 2010-10-03 NOTE — Assessment & Plan Note (Signed)
No evidence of bony damage Minor bleeding considering he is on coumadin Several skin breaks that are covered well with steristrips Will update Td today

## 2010-10-06 ENCOUNTER — Other Ambulatory Visit: Payer: Self-pay | Admitting: Internal Medicine

## 2010-10-08 ENCOUNTER — Ambulatory Visit (INDEPENDENT_AMBULATORY_CARE_PROVIDER_SITE_OTHER): Payer: Medicare Other | Admitting: Internal Medicine

## 2010-10-08 ENCOUNTER — Other Ambulatory Visit: Payer: Self-pay | Admitting: *Deleted

## 2010-10-08 DIAGNOSIS — E119 Type 2 diabetes mellitus without complications: Secondary | ICD-10-CM

## 2010-10-08 DIAGNOSIS — I4891 Unspecified atrial fibrillation: Secondary | ICD-10-CM

## 2010-10-08 DIAGNOSIS — Z7901 Long term (current) use of anticoagulants: Secondary | ICD-10-CM

## 2010-10-08 DIAGNOSIS — Z5181 Encounter for therapeutic drug level monitoring: Secondary | ICD-10-CM

## 2010-10-08 LAB — POCT INR: INR: 2.4

## 2010-10-08 LAB — HEMOGLOBIN A1C: Hgb A1c MFr Bld: 6.1 % (ref 4.6–6.5)

## 2010-10-08 MED ORDER — ROSUVASTATIN CALCIUM 20 MG PO TABS: 20.0000 mg | ORAL_TABLET | Freq: Every day | ORAL | Status: DC

## 2010-10-08 NOTE — Patient Instructions (Signed)
Continue 2.5 mg daily, 5 mg tues, sat, recheck 4 weeks

## 2010-10-09 ENCOUNTER — Encounter: Payer: Self-pay | Admitting: *Deleted

## 2010-10-10 ENCOUNTER — Encounter: Payer: Self-pay | Admitting: Cardiology

## 2010-10-10 ENCOUNTER — Ambulatory Visit (INDEPENDENT_AMBULATORY_CARE_PROVIDER_SITE_OTHER): Payer: Medicare Other | Admitting: Cardiology

## 2010-10-10 VITALS — BP 116/63 | HR 66 | Resp 18 | Ht 66.0 in | Wt 144.1 lb

## 2010-10-10 DIAGNOSIS — I4891 Unspecified atrial fibrillation: Secondary | ICD-10-CM

## 2010-10-10 DIAGNOSIS — E785 Hyperlipidemia, unspecified: Secondary | ICD-10-CM

## 2010-10-10 DIAGNOSIS — I10 Essential (primary) hypertension: Secondary | ICD-10-CM

## 2010-10-10 DIAGNOSIS — I2510 Atherosclerotic heart disease of native coronary artery without angina pectoris: Secondary | ICD-10-CM

## 2010-10-10 NOTE — Patient Instructions (Signed)
Your physician recommends that you schedule a follow-up appointment in: 6 months with Dr. Stuckey   

## 2010-10-11 DIAGNOSIS — I2510 Atherosclerotic heart disease of native coronary artery without angina pectoris: Secondary | ICD-10-CM | POA: Insufficient documentation

## 2010-10-11 NOTE — Assessment & Plan Note (Addendum)
Class I for his age.  Walks 2 miles daily.  No angina.  Prior CABG.  Details in paper chart

## 2010-10-11 NOTE — Assessment & Plan Note (Signed)
Controlled.  

## 2010-10-11 NOTE — Assessment & Plan Note (Signed)
Maintaining NSR.  Occasional Af suspected.  NSR now.
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
## 2010-10-11 NOTE — Assessment & Plan Note (Signed)
Overall numbers reasonably good.  Last LDL was 78.

## 2010-10-22 ENCOUNTER — Ambulatory Visit (INDEPENDENT_AMBULATORY_CARE_PROVIDER_SITE_OTHER): Payer: Medicare Other | Admitting: *Deleted

## 2010-10-22 DIAGNOSIS — I495 Sick sinus syndrome: Secondary | ICD-10-CM

## 2010-10-22 NOTE — Progress Notes (Signed)
Pacer check/[place] 

## 2010-11-04 ENCOUNTER — Other Ambulatory Visit: Payer: Self-pay | Admitting: Internal Medicine

## 2010-11-04 DIAGNOSIS — E785 Hyperlipidemia, unspecified: Secondary | ICD-10-CM

## 2010-11-04 DIAGNOSIS — E119 Type 2 diabetes mellitus without complications: Secondary | ICD-10-CM

## 2010-11-04 DIAGNOSIS — I10 Essential (primary) hypertension: Secondary | ICD-10-CM

## 2010-11-05 ENCOUNTER — Ambulatory Visit (INDEPENDENT_AMBULATORY_CARE_PROVIDER_SITE_OTHER): Payer: Medicare Other | Admitting: Internal Medicine

## 2010-11-05 ENCOUNTER — Encounter: Payer: Self-pay | Admitting: Internal Medicine

## 2010-11-05 DIAGNOSIS — E041 Nontoxic single thyroid nodule: Secondary | ICD-10-CM

## 2010-11-05 DIAGNOSIS — E785 Hyperlipidemia, unspecified: Secondary | ICD-10-CM

## 2010-11-05 DIAGNOSIS — E119 Type 2 diabetes mellitus without complications: Secondary | ICD-10-CM

## 2010-11-05 DIAGNOSIS — I4891 Unspecified atrial fibrillation: Secondary | ICD-10-CM

## 2010-11-05 DIAGNOSIS — I10 Essential (primary) hypertension: Secondary | ICD-10-CM

## 2010-11-05 DIAGNOSIS — Z5181 Encounter for therapeutic drug level monitoring: Secondary | ICD-10-CM

## 2010-11-05 DIAGNOSIS — Z7901 Long term (current) use of anticoagulants: Secondary | ICD-10-CM

## 2010-11-05 DIAGNOSIS — N400 Enlarged prostate without lower urinary tract symptoms: Secondary | ICD-10-CM

## 2010-11-05 LAB — BASIC METABOLIC PANEL
CO2: 29 mEq/L (ref 19–32)
Chloride: 107 mEq/L (ref 96–112)
Glucose, Bld: 114 mg/dL — ABNORMAL HIGH (ref 70–99)
Sodium: 142 mEq/L (ref 135–145)

## 2010-11-05 LAB — LIPID PANEL
Cholesterol: 157 mg/dL (ref 0–200)
VLDL: 11.6 mg/dL (ref 0.0–40.0)

## 2010-11-05 LAB — HEPATIC FUNCTION PANEL
ALT: 19 U/L (ref 0–53)
AST: 20 U/L (ref 0–37)
Albumin: 4 g/dL (ref 3.5–5.2)
Alkaline Phosphatase: 72 U/L (ref 39–117)
Bilirubin, Direct: 0.2 mg/dL (ref 0.0–0.3)
Total Protein: 6.7 g/dL (ref 6.0–8.3)

## 2010-11-05 LAB — CBC WITH DIFFERENTIAL/PLATELET
Basophils Absolute: 0 10*3/uL (ref 0.0–0.1)
Eosinophils Relative: 1 % (ref 0.0–5.0)
HCT: 42.7 % (ref 39.0–52.0)
Hemoglobin: 14.3 g/dL (ref 13.0–17.0)
Lymphocytes Relative: 36.3 % (ref 12.0–46.0)
Monocytes Relative: 10.2 % (ref 3.0–12.0)
Neutro Abs: 2.3 10*3/uL (ref 1.4–7.7)
Platelets: 219 10*3/uL (ref 150.0–400.0)
RDW: 13.7 % (ref 11.5–14.6)
WBC: 4.3 10*3/uL — ABNORMAL LOW (ref 4.5–10.5)

## 2010-11-05 LAB — HEMOGLOBIN A1C: Hgb A1c MFr Bld: 5.9 % (ref 4.6–6.5)

## 2010-11-05 LAB — TSH: TSH: 0.88 u[IU]/mL (ref 0.35–5.50)

## 2010-11-05 NOTE — Progress Notes (Signed)
Addended by: Baldomero Lamy on: 11/05/2010 10:53 AM   Modules accepted: Orders

## 2010-11-05 NOTE — Assessment & Plan Note (Signed)
Still has excellent control He likes to check freq Lab Results  Component Value Date   HGBA1C 6.1 10/08/2010

## 2010-11-05 NOTE — Assessment & Plan Note (Signed)
Lab Results  Component Value Date   LDLCALC 78 04/15/2010   Good control Due for labs

## 2010-11-05 NOTE — Assessment & Plan Note (Signed)
BP Readings from Last 3 Encounters:  11/05/10 120/60  10/10/10 116/63  10/03/10 148/60   Still with excellent control No changes needed

## 2010-11-05 NOTE — Assessment & Plan Note (Signed)
occ skips but better off caffeine Regular now On coumadin

## 2010-11-05 NOTE — Progress Notes (Signed)
Subjective:    Patient ID: [name], male    DOB: 19-Mar-1918, 77 y.o.   MRN: [id number]  HPI All healed up from his fall Recent eval by Dr York Spaniel new concerns  He is concerned about his cholesterol and all Labs will be done today  Checks sugars regularly-- tid still Trying [employer] brand for strips--reading 16-35 higher than the One Touch Fasting is in 130s but usually <110 with One touch No sig hypoglycemic reactions--did have a 64 but no sig symptoms  No change in exercise tolerance Did note some fluctuations in heart beat a couple of months ago---better when he cut out caffeine coffee Did have some chest pain then, none since then Breathing okay No sig edema  Voiding okay Nocturia x 2---stable  Current Outpatient Prescriptions on File Prior to Visit  Medication Sig Dispense Refill  . amLODipine (NORVASC) 10 MG tablet Take 10 mg by mouth daily.        . Ascorbic Acid (VITAMIN C) 1000 MG tablet Take 1,000 mg by mouth daily.        Marland Kitchen aspirin 81 MG tablet Take 81 mg by mouth daily.        . B Complex-C-E-Zn (BEC/ZINC) TABS Take 1 tablet by mouth daily.        Marland Kitchen glimepiride (AMARYL) 1 MG tablet Take 1 mg by mouth daily.        Marland Kitchen glucose blood test strip 1 each by Other route as needed. Use as instructed. Test daily.       Marland Kitchen NIASPAN 500 MG CR tablet TAKE ONE TABLET BY MOUTH ONE TIME DAILY  90 each  3  . nitroGLYCERIN (NITROSTAT) 0.4 MG SL tablet Place 0.4 mg under the tongue every 5 (five) minutes as needed.        . rosuvastatin (CRESTOR) 20 MG tablet Take 1 tablet (20 mg total) by mouth daily. UAD   60 tablet  3  . warfarin (COUMADIN) 5 MG tablet TAKE/USE PER MANUFACTURER INSTRUCTIONS OR DIRECTIONS FROM PHYSICIAN  120 tablet  1  . Zn-Pyg Afri-Nettle-Saw Palmet (SAW PALMETTO COMPLEX) CAPS Take 1 capsule by mouth daily.          Allergies  Allergen Reactions  . Cefuroxime Axetil     REACTION: unspecified  . Heparin     REACTION: rash    Past Medical History    Diagnosis Date  . CAD (coronary artery disease) 1974    Dr. Riley Kill   . DM2 (diabetes mellitus, type 2) 2003  . HTN (hypertension)   . BPH (benign prostatic hypertrophy)   . Thyroid cyst     incidental finding on ultra sound  . Atrial fibrillation   . Hyperlipidemia   . SSS (sick sinus syndrome)     Past Surgical History  Procedure Date  . Coronary artery bypass graft 1994  . Permanent pacemaker 04/09/06    dual chamber permanent pacemaker  . Tonsillectomy     childhood  . Pilonidal cystectomy     Family History  Problem Relation Age of Onset  . Colon cancer Neg Hx   . Prostate cancer Neg Hx     History   Social History  . Marital Status: Married    Spouse Name: N/A    Number of Children: N/A  . Years of Education: N/A   Occupational History  . retired Airline pilot for [employer]    Social History Main Topics  . Smoking status: Former Games developer  . Smokeless tobacco: Never  Used  . Alcohol Use: No  . Drug Use: No  . Sexually Active: Not on file   Other Topics Concern  . Not on file   Social History Narrative    Has a living will- wife or attorney [name] to have health care POA. Would accept resuscitation but no prolonged machines, not sure about feeding tube.   Review of Systems Sleeps well Appetite is fine Weight stable     Objective:   Physical Exam  Constitutional: He appears well-developed and well-nourished. No distress.  Neck: Normal range of motion. Neck supple. No thyromegaly present.  Cardiovascular: Normal rate, regular rhythm, normal heart sounds and intact distal pulses.  Exam reveals no gallop.   No murmur heard. Pulmonary/Chest: Effort normal and breath sounds normal. No respiratory distress. He has no wheezes. He has no rales.  Musculoskeletal: Normal range of motion. He exhibits no edema and no tenderness.  Lymphadenopathy:    He has no cervical adenopathy.  Neurological:       Normal sensation in feet  Skin: No rash noted.        No ulcers or foot lesions          Assessment & Plan:

## 2010-11-05 NOTE — Assessment & Plan Note (Signed)
Voids okay No Rx needed 

## 2010-11-05 NOTE — Patient Instructions (Signed)
Continue current dose, check in 4 weeks  

## 2010-11-06 ENCOUNTER — Encounter: Payer: Self-pay | Admitting: *Deleted

## 2010-11-12 ENCOUNTER — Non-Acute Institutional Stay: Payer: Medicare Other | Admitting: Family Medicine

## 2010-11-12 DIAGNOSIS — I4891 Unspecified atrial fibrillation: Secondary | ICD-10-CM

## 2010-11-12 DIAGNOSIS — T148XXA Other injury of unspecified body region, initial encounter: Secondary | ICD-10-CM

## 2010-11-12 MED ORDER — DOXYCYCLINE HYCLATE 100 MG PO TABS: 100.0000 mg | ORAL_TABLET | Freq: Two times a day (BID) | ORAL | Status: DC

## 2010-11-12 MED ORDER — DOXYCYCLINE HYCLATE 100 MG PO TABS: 100.0000 mg | ORAL_TABLET | Freq: Two times a day (BID) | ORAL | Status: AC

## 2010-11-12 NOTE — Progress Notes (Signed)
Addended by: Dianne Dun on: 11/12/2010 03:19 PM   Modules accepted: Orders

## 2010-11-12 NOTE — Progress Notes (Signed)
Subjective:    Patient ID: [name], male    DOB: 01-21-1918, 77 y.o.   MRN: [id number]  HPI  77 yo new to me here due to ulcers on his knees.  Walks two miles everyday.  Wears knee braces that he thinks rubbed up against his knee and caused open wounds bilaterally. Evaluated by Angelica Chessman, RN yesterday and she was concerned about cellulitis/wound infection. Afebrile. No n/v/d.  On coumadin. Allergy to cefuroxime, ?PCN as well.   Current Outpatient Prescriptions on File Prior to Visit  Medication Sig Dispense Refill  . amLODipine (NORVASC) 10 MG tablet Take 10 mg by mouth daily.        . Ascorbic Acid (VITAMIN C) 1000 MG tablet Take 1,000 mg by mouth daily.        Marland Kitchen aspirin 81 MG tablet Take 81 mg by mouth daily.        . B Complex-C-E-Zn (BEC/ZINC) TABS Take 1 tablet by mouth daily.        Marland Kitchen glimepiride (AMARYL) 1 MG tablet Take 1 mg by mouth daily.        Marland Kitchen glucose blood test strip 1 each by Other route as needed. Use as instructed. Test daily.       Marland Kitchen NIASPAN 500 MG CR tablet TAKE ONE TABLET BY MOUTH ONE TIME DAILY  90 each  3  . nitroGLYCERIN (NITROSTAT) 0.4 MG SL tablet Place 0.4 mg under the tongue every 5 (five) minutes as needed.        . rosuvastatin (CRESTOR) 20 MG tablet Take 1 tablet (20 mg total) by mouth daily. UAD   60 tablet  3  . warfarin (COUMADIN) 5 MG tablet TAKE/USE PER MANUFACTURER INSTRUCTIONS OR DIRECTIONS FROM PHYSICIAN  120 tablet  1  . Zn-Pyg Afri-Nettle-Saw Palmet (SAW PALMETTO COMPLEX) CAPS Take 1 capsule by mouth daily.          Allergies  Allergen Reactions  . Cefuroxime Axetil     REACTION: unspecified  . Heparin     REACTION: rash    Past Medical History  Diagnosis Date  . CAD (coronary artery disease) 1974    Dr. Riley Kill   . DM2 (diabetes mellitus, type 2) 2003  . HTN (hypertension)   . BPH (benign prostatic hypertrophy)   . Thyroid cyst     incidental finding on ultra sound  . Atrial fibrillation   . Hyperlipidemia   . SSS (sick  sinus syndrome)     Past Surgical History  Procedure Date  . Coronary artery bypass graft 1994  . Permanent pacemaker 04/09/06    dual chamber permanent pacemaker  . Tonsillectomy     childhood  . Pilonidal cystectomy     Family History  Problem Relation Age of Onset  . Colon cancer Neg Hx   . Prostate cancer Neg Hx     History   Social History  . Marital Status: Married    Spouse Name: N/A    Number of Children: N/A  . Years of Education: N/A   Occupational History  . retired Airline pilot for [employer]    Social History Main Topics  . Smoking status: Former Games developer  . Smokeless tobacco: Never Used  . Alcohol Use: No  . Drug Use: No  . Sexually Active: Not on file   Other Topics Concern  . Not on file   Social History Narrative    Has a living will- wife or attorney [name] to have health care POA.  Would accept resuscitation but no prolonged machines, not sure about feeding tube.   Review of Systems See HPI     Objective:   Physical Exam  Constitutional: He appears well-developed and well-nourished. No distress.  Neck: Normal range of motion. Neck supple. No thyromegaly present.  Cardiovascular: Normal rate, regular rhythm, normal heart sounds and intact distal pulses.  Exam reveals no gallop.   No murmur heard. Pulmonary/Chest: Effort normal and breath sounds normal. No respiratory distress. He has no wheezes. He has no rales.  Musculoskeletal: Normal range of motion. He exhibits no edema and no tenderness.  Lymphadenopathy:    He has no cervical adenopathy.  Neurological:       Normal sensation in feet  Skin: 1.5 cm bilateral circular abrasions with open central area- yellowish in color, no drainage or foul odor.  No surrounding erythema.          Assessment & Plan:  1.  Bilateral leg wounds- Will place on abx given appearance today. Doxycycline given his allergies. Will check PT/INR on Monday given that he is coumadin. Discussed signs of  bleeding. The patient indicates understanding of these issues and agrees with the plan.

## 2010-11-20 ENCOUNTER — Encounter: Payer: Self-pay | Admitting: Internal Medicine

## 2010-11-20 ENCOUNTER — Telehealth: Payer: Self-pay | Admitting: *Deleted

## 2010-11-20 NOTE — Telephone Encounter (Signed)
Called pt to advise that PT/INR results are fine, per Dr.Letvak, no change in coumadin and finish out all antibiotics.

## 2010-12-03 ENCOUNTER — Ambulatory Visit (INDEPENDENT_AMBULATORY_CARE_PROVIDER_SITE_OTHER): Payer: Medicare Other | Admitting: Internal Medicine

## 2010-12-03 DIAGNOSIS — I4891 Unspecified atrial fibrillation: Secondary | ICD-10-CM

## 2010-12-03 DIAGNOSIS — Z7901 Long term (current) use of anticoagulants: Secondary | ICD-10-CM

## 2010-12-03 DIAGNOSIS — Z5181 Encounter for therapeutic drug level monitoring: Secondary | ICD-10-CM

## 2010-12-03 NOTE — Patient Instructions (Signed)
Continue current dose, check in 4 weeks  

## 2010-12-30 ENCOUNTER — Ambulatory Visit (INDEPENDENT_AMBULATORY_CARE_PROVIDER_SITE_OTHER): Payer: Medicare Other | Admitting: Family Medicine

## 2010-12-30 ENCOUNTER — Encounter: Payer: Self-pay | Admitting: Family Medicine

## 2010-12-30 ENCOUNTER — Ambulatory Visit (INDEPENDENT_AMBULATORY_CARE_PROVIDER_SITE_OTHER): Payer: Medicare Other | Admitting: Internal Medicine

## 2010-12-30 VITALS — BP 124/66 | HR 66 | Temp 98.5°F | Wt 148.8 lb

## 2010-12-30 DIAGNOSIS — I4891 Unspecified atrial fibrillation: Secondary | ICD-10-CM

## 2010-12-30 DIAGNOSIS — S39012A Strain of muscle, fascia and tendon of lower back, initial encounter: Secondary | ICD-10-CM

## 2010-12-30 DIAGNOSIS — Z5181 Encounter for therapeutic drug level monitoring: Secondary | ICD-10-CM

## 2010-12-30 DIAGNOSIS — Z23 Encounter for immunization: Secondary | ICD-10-CM

## 2010-12-30 DIAGNOSIS — S335XXA Sprain of ligaments of lumbar spine, initial encounter: Secondary | ICD-10-CM

## 2010-12-30 DIAGNOSIS — R109 Unspecified abdominal pain: Secondary | ICD-10-CM

## 2010-12-30 DIAGNOSIS — Z7901 Long term (current) use of anticoagulants: Secondary | ICD-10-CM

## 2010-12-30 LAB — POCT URINALYSIS DIPSTICK
Bilirubin, UA: NEGATIVE
Glucose, UA: NEGATIVE
Ketones, UA: NEGATIVE
Leukocytes, UA: NEGATIVE
pH, UA: 6

## 2010-12-30 NOTE — Patient Instructions (Signed)
Continue current dose, check in 4 weeks  

## 2010-12-30 NOTE — Assessment & Plan Note (Signed)
UA normal. Likely lumbar strain, no red flags. Supportive care discussed.  Ice/heat, tylenol.  Stretching from [facility] pt advisor provided. Advised to notify us if changing sxs or not improving as expected.

## 2010-12-30 NOTE — Progress Notes (Signed)
Addended by: Josph Macho A on: 12/30/2010 10:09 AM   Modules accepted: Orders

## 2010-12-30 NOTE — Patient Instructions (Signed)
I think you have lumbar strain.  Look at handout provided for suggested stretching exercises. May use rubbing compound as well as ice/heat, and tylenol. Please let us know if not improving as expected.

## 2010-12-30 NOTE — Progress Notes (Signed)
  Subjective:    Patient ID: [name], male    DOB: [birth year], 77 y.o.   MRN: [id number]  HPI CC: flank pain?  77 yo pleasant new to me with h/o HTN, T2DM, Afib on coumadin and CAD with pacer in place presents with 3d h/o pain on right lower back below belt, traveled to hip.  Took tylenol which didn't help.  Today took 500mg  tylenol, pain has started improving.  Pain described as continual pain.  Not positional.  No fevers/chills, BM changes, diarrhea, constipation, blood in stool, dysuria, urgency or frequency, nausea/vomiting.  Appetite stayed normal with him.  No bowel/bladder incontinence, no leg weakness, numbness.  Does walk 2 mi daily.  Still walking daily even with pain.  Did lift desk, but was light and doesn't remember hurting himself.  Has had urinary tract infection in past, felt similar to this.  Lives at [place].  H/o SSS and BPH.  H/o afib, on coumadin.  To check tomorrow.  Would like checked today as well as flu shot today.  Review of Systems Per HPI    Objective:   Physical Exam  Nursing note and vitals reviewed. Constitutional: He is oriented to person, place, and time. He appears well-developed and well-nourished. No distress.  HENT:  Head: Normocephalic and atraumatic.  Mouth/Throat: Oropharynx is clear and moist. No oropharyngeal exudate.  Eyes: Conjunctivae and EOM are normal. Pupils are equal, round, and reactive to light. No scleral icterus.  Neck: Normal range of motion. Neck supple.  Cardiovascular: Normal rate, regular rhythm, normal heart sounds and intact distal pulses.   No murmur heard. Pulmonary/Chest: Effort normal and breath sounds normal. No respiratory distress. He has no wheezes. He has no rales.  Abdominal: Soft. Bowel sounds are normal. He exhibits no distension and no mass. There is no hepatosplenomegaly. There is no tenderness. There is no rebound, no guarding and no CVA tenderness.  Musculoskeletal: He exhibits no edema.   Lumbar back: Normal.       No midline or paraspinous mm tenderness lumbar region. No SIJ, GTB, or sciatic notch pain. No pain with int/ext rotation at hip. Neg SLR test.  Lymphadenopathy:    He has no cervical adenopathy.       Right: No supraclavicular adenopathy present.       Left: No supraclavicular adenopathy present.  Neurological: He is alert and oriented to person, place, and time. He has normal strength. No sensory deficit.  Skin: Skin is warm and dry. No rash noted.  Psychiatric: He has a normal mood and affect.          Assessment & Plan:

## 2010-12-31 ENCOUNTER — Ambulatory Visit: Payer: Medicare Other

## 2011-01-22 ENCOUNTER — Other Ambulatory Visit: Payer: Self-pay | Admitting: Internal Medicine

## 2011-01-28 ENCOUNTER — Ambulatory Visit (INDEPENDENT_AMBULATORY_CARE_PROVIDER_SITE_OTHER): Payer: Medicare Other | Admitting: Internal Medicine

## 2011-01-28 DIAGNOSIS — I4891 Unspecified atrial fibrillation: Secondary | ICD-10-CM

## 2011-01-28 DIAGNOSIS — Z7901 Long term (current) use of anticoagulants: Secondary | ICD-10-CM

## 2011-01-28 DIAGNOSIS — Z5181 Encounter for therapeutic drug level monitoring: Secondary | ICD-10-CM

## 2011-01-28 NOTE — Patient Instructions (Signed)
Continue current dose, check in 4 weeks  

## 2011-02-25 ENCOUNTER — Ambulatory Visit (INDEPENDENT_AMBULATORY_CARE_PROVIDER_SITE_OTHER): Payer: Medicare Other | Admitting: Internal Medicine

## 2011-02-25 DIAGNOSIS — I4891 Unspecified atrial fibrillation: Secondary | ICD-10-CM

## 2011-02-25 DIAGNOSIS — Z5181 Encounter for therapeutic drug level monitoring: Secondary | ICD-10-CM

## 2011-02-25 DIAGNOSIS — I25810 Atherosclerosis of coronary artery bypass graft(s) without angina pectoris: Secondary | ICD-10-CM

## 2011-02-25 DIAGNOSIS — Z7901 Long term (current) use of anticoagulants: Secondary | ICD-10-CM

## 2011-02-25 LAB — POCT INR: INR: 2.8

## 2011-02-25 NOTE — Patient Instructions (Signed)
Continue current dose, check in 4 weeks  

## 2011-04-01 ENCOUNTER — Ambulatory Visit (INDEPENDENT_AMBULATORY_CARE_PROVIDER_SITE_OTHER): Payer: Medicare Other | Admitting: Family Medicine

## 2011-04-01 DIAGNOSIS — I25810 Atherosclerosis of coronary artery bypass graft(s) without angina pectoris: Secondary | ICD-10-CM

## 2011-04-01 DIAGNOSIS — Z5181 Encounter for therapeutic drug level monitoring: Secondary | ICD-10-CM

## 2011-04-01 DIAGNOSIS — I4891 Unspecified atrial fibrillation: Secondary | ICD-10-CM

## 2011-04-01 DIAGNOSIS — Z7901 Long term (current) use of anticoagulants: Secondary | ICD-10-CM

## 2011-04-01 LAB — POCT INR: INR: 2

## 2011-04-01 NOTE — Patient Instructions (Addendum)
2.5 mg daily, 5 mg Tue,Sat 4 weeks

## 2011-04-08 ENCOUNTER — Ambulatory Visit (INDEPENDENT_AMBULATORY_CARE_PROVIDER_SITE_OTHER): Payer: Medicare Other | Admitting: Cardiology

## 2011-04-08 DIAGNOSIS — I10 Essential (primary) hypertension: Secondary | ICD-10-CM

## 2011-04-08 DIAGNOSIS — Z950 Presence of cardiac pacemaker: Secondary | ICD-10-CM

## 2011-04-08 DIAGNOSIS — E7800 Pure hypercholesterolemia, unspecified: Secondary | ICD-10-CM

## 2011-04-08 DIAGNOSIS — I2510 Atherosclerotic heart disease of native coronary artery without angina pectoris: Secondary | ICD-10-CM

## 2011-04-08 DIAGNOSIS — I4891 Unspecified atrial fibrillation: Secondary | ICD-10-CM

## 2011-04-08 NOTE — Patient Instructions (Signed)
Your physician wants you to follow-up in: 6 MONTHS.  You will receive a reminder letter in the mail two months in advance. If you don't receive a letter, please call our office to schedule the follow-up appointment.  Your physician recommends that you continue on your current medications as directed. Please refer to the Current Medication list given to you today.  Please have your labs followed with Dr Alphonsus Sias.

## 2011-04-11 NOTE — Progress Notes (Signed)
HPI:  [name] is in for follow up.  He is doing pretty well overall. He denies any chest pain.  He has not had further problems.  He is getting his labs with Dr. Alphonsus [name].  [name] is getting ready to see Dr. Mariah [name] in the office in [place], and we suggested [name] might eventually have follow up there.  He is to see EP there soon.   Tolerating everything relatively well given his age. No current complaints.    Current Outpatient Prescriptions  Medication Sig Dispense Refill  . amLODipine (NORVASC) 10 MG tablet Take 10 mg by mouth daily.        . Ascorbic Acid (VITAMIN C) 1000 MG tablet Take 1,000 mg by mouth daily.        Marland Kitchen aspirin 81 MG tablet Take 81 mg by mouth daily.        . B Complex-C-E-Zn (BEC/ZINC) TABS Take 1 tablet by mouth daily.        Marland Kitchen glimepiride (AMARYL) 1 MG tablet TAKE ONE TABLET BY MOUTH ONE TIME DAILY  90 tablet  1  . NIASPAN 500 MG CR tablet TAKE ONE TABLET BY MOUTH ONE TIME DAILY  90 each  3  . nitroGLYCERIN (NITROSTAT) 0.4 MG SL tablet Place 0.4 mg under the tongue every 5 (five) minutes as needed.        . rosuvastatin (CRESTOR) 20 MG tablet Take 20 mg by mouth daily. Take 1/2 daily      . warfarin (COUMADIN) 5 MG tablet TAKE/USE PER MANUFACTURER INSTRUCTIONS OR DIRECTIONS FROM PHYSICIAN  120 tablet  1  . Zn-Pyg Afri-Nettle-Saw Palmet (SAW PALMETTO COMPLEX) CAPS Take 1 capsule by mouth daily.        Marland Kitchen glucose blood test strip 1 each by Other route as needed. Use as instructed. Test daily.         Allergies  Allergen Reactions  . Cefuroxime Axetil     REACTION: unspecified  . Heparin     REACTION: rash    Past Medical History  Diagnosis Date  . CAD (coronary artery disease) 1974    Dr. Riley [name]   . DM2 (diabetes mellitus, type 2) 2003  . HTN (hypertension)   . BPH (benign prostatic hypertrophy)   . Thyroid cyst     incidental finding on ultra sound  . Atrial fibrillation   . Hyperlipidemia   . SSS (sick sinus syndrome)     Past Surgical History    Procedure Date  . Coronary artery bypass graft 1994  . Permanent pacemaker 04/09/06    dual chamber permanent pacemaker  . Tonsillectomy     childhood  . Pilonidal cystectomy     Family History  Problem Relation Age of Onset  . Colon cancer Neg Hx   . Prostate cancer Neg Hx     History   Social History  . Marital Status: Married    Spouse Name: N/A    Number of Children: N/A  . Years of Education: N/A   Occupational History  . retired Airline pilot for [employer]    Social History Main Topics  . Smoking status: Former Games developer  . Smokeless tobacco: Never Used  . Alcohol Use: No  . Drug Use: No  . Sexually Active: Not on file   Other Topics Concern  . Not on file   Social History Narrative    Has a living will- wife or attorney [name] to have health care POA. Would accept resuscitation but no  prolonged machines, not sure about feeding tube.    ROS: Please see the HPI.  All other systems reviewed and negative.  PHYSICAL EXAM:  BP 120/64  Pulse 80  Ht 5\' 6"  (1.676 m)  Wt 67.586 kg (149 lb)  BMI 24.05 kg/m2  General: elderly and slightly more feeble than on prior visits.  Still quite alert.   Head:  Normocephalic and atraumatic. Neck: no JVD Lungs: Clear to auscultation and percussion. Heart: Normal S1 and S2.  Soft SEM.  No DM.    Abdomen:  Normal bowel sounds; soft; non tender; no organomegaly Pulses: Pulses normal in all 4 extremities. Extremities: No clubbing or cyanosis. No edema. Neurologic: Alert and oriented x 3.  EKG:  Competition between SR, and atrial pacing.  Otherwise, nonspecific ST and T wave changes.    ASSESSMENT AND PLAN:

## 2011-04-12 NOTE — Assessment & Plan Note (Signed)
Currently in NSR.  On warfarin, and ASA----will keep on given his aging grafts despite uncertain data.

## 2011-04-12 NOTE — Assessment & Plan Note (Signed)
Well controlled.  Not elevated at this point.

## 2011-04-12 NOTE — Assessment & Plan Note (Signed)
Not far off target.  He is happy with his regimen, and is near target.  We might be able to get by without Niaspan given the data if he we need to economize his meds.

## 2011-04-12 NOTE — Assessment & Plan Note (Signed)
Scheduled to see Dr. Karie Schwalbe in [place].

## 2011-04-12 NOTE — Assessment & Plan Note (Signed)
No symptoms at present. Has soft SEM, might check echo in the future, but not symptomatic.  Approaching 93.

## 2011-04-21 ENCOUNTER — Encounter: Payer: Medicare Other | Admitting: Internal Medicine

## 2011-04-29 ENCOUNTER — Ambulatory Visit (INDEPENDENT_AMBULATORY_CARE_PROVIDER_SITE_OTHER): Payer: Medicare Other | Admitting: Internal Medicine

## 2011-04-29 DIAGNOSIS — E119 Type 2 diabetes mellitus without complications: Secondary | ICD-10-CM

## 2011-04-29 DIAGNOSIS — I25810 Atherosclerosis of coronary artery bypass graft(s) without angina pectoris: Secondary | ICD-10-CM

## 2011-04-29 DIAGNOSIS — I4891 Unspecified atrial fibrillation: Secondary | ICD-10-CM

## 2011-04-29 DIAGNOSIS — Z7901 Long term (current) use of anticoagulants: Secondary | ICD-10-CM

## 2011-04-29 DIAGNOSIS — E785 Hyperlipidemia, unspecified: Secondary | ICD-10-CM

## 2011-04-29 DIAGNOSIS — Z5181 Encounter for therapeutic drug level monitoring: Secondary | ICD-10-CM

## 2011-04-29 LAB — LIPID PANEL
Cholesterol: 145 mg/dL (ref 0–200)
LDL Cholesterol: 74 mg/dL (ref 0–99)
Triglycerides: 55 mg/dL (ref 0.0–149.0)

## 2011-04-29 LAB — POCT INR: INR: 2.9

## 2011-04-29 NOTE — Patient Instructions (Signed)
Continue 2.5 mg daily, 5 mg Tue,Sat recheck 4 weeks 

## 2011-05-02 ENCOUNTER — Other Ambulatory Visit: Payer: Self-pay | Admitting: Internal Medicine

## 2011-05-05 ENCOUNTER — Encounter: Payer: Self-pay | Admitting: *Deleted

## 2011-05-13 ENCOUNTER — Ambulatory Visit (INDEPENDENT_AMBULATORY_CARE_PROVIDER_SITE_OTHER): Payer: Medicare Other | Admitting: Internal Medicine

## 2011-05-13 ENCOUNTER — Encounter: Payer: Self-pay | Admitting: Internal Medicine

## 2011-05-13 VITALS — BP 132/72 | HR 62 | Ht 66.0 in | Wt 149.8 lb

## 2011-05-13 DIAGNOSIS — I2510 Atherosclerotic heart disease of native coronary artery without angina pectoris: Secondary | ICD-10-CM

## 2011-05-13 DIAGNOSIS — I4891 Unspecified atrial fibrillation: Secondary | ICD-10-CM

## 2011-05-13 DIAGNOSIS — Z950 Presence of cardiac pacemaker: Secondary | ICD-10-CM

## 2011-05-13 LAB — PACEMAKER DEVICE OBSERVATION
AL THRESHOLD: 0.7 V
ATRIAL PACING PM: 79
BAMS-0001: 170 {beats}/min
BAMS-0002: 8 ms
BAMS-0003: 70 {beats}/min
DEVICE MODEL PM: 318781
RV LEAD IMPEDENCE PM: 470 Ohm
RV LEAD THRESHOLD: 0.9 V

## 2011-05-13 NOTE — Assessment & Plan Note (Signed)
His device is working normally. Plan to recheck in several months. 

## 2011-05-13 NOTE — Assessment & Plan Note (Signed)
He denies anginal symptoms. He will continue walking regularly. He will continue his current medical therapy.

## 2011-05-13 NOTE — Progress Notes (Signed)
HPI Mr. Baranowski returns today for followup. He is a very pleasant 78 year old man with a history of hypertension, symptomatic bradycardia, coronary artery disease status post bypass surgery, and dyslipidemia. In the interim, he has done well. He denies chest pain, shortness of breath, and has had only minimal peripheral edema. He walks 2 miles nearly every day. He has no symptoms while doing this. Allergies  Allergen Reactions  . Cefuroxime Axetil     REACTION: unspecified  . Heparin     REACTION: rash     Current Outpatient Prescriptions  Medication Sig Dispense Refill  . amLODipine (NORVASC) 10 MG tablet TAKE ONE TABLET BY MOUTH ONE TIME DAILY  90 tablet  1  . Ascorbic Acid (VITAMIN C) 1000 MG tablet Take 1,000 mg by mouth daily.        Marland Kitchen aspirin 81 MG tablet Take 81 mg by mouth daily.        . B Complex-C-E-Zn (BEC/ZINC) TABS Take 1 tablet by mouth daily.        Marland Kitchen glimepiride (AMARYL) 1 MG tablet TAKE ONE TABLET BY MOUTH ONE TIME DAILY  90 tablet  1  . glucose blood test strip 1 each by Other route as needed. Use as instructed. Test daily.       Marland Kitchen NIASPAN 500 MG CR tablet TAKE ONE TABLET BY MOUTH ONE TIME DAILY  90 each  3  . nitroGLYCERIN (NITROSTAT) 0.4 MG SL tablet Place 0.4 mg under the tongue every 5 (five) minutes as needed.        . rosuvastatin (CRESTOR) 20 MG tablet Take 20 mg by mouth daily. Take 1/2 daily      . warfarin (COUMADIN) 5 MG tablet TAKE/USE PER MANUFACTURER INSTRUCTIONS OR DIRECTIONS FROM PHYSICIAN  120 tablet  1  . Zn-Pyg Afri-Nettle-Saw Palmet (SAW PALMETTO COMPLEX) CAPS Take 1 capsule by mouth daily.           Past Medical History  Diagnosis Date  . CAD (coronary artery disease) 1974    Dr. Riley Kill   . DM2 (diabetes mellitus, type 2) 2003  . HTN (hypertension)   . BPH (benign prostatic hypertrophy)   . Thyroid cyst     incidental finding on ultra sound  . Atrial fibrillation   . Hyperlipidemia   . SSS (sick sinus syndrome)     ROS:   All systems  reviewed and negative except as noted in the HPI.   Past Surgical History  Procedure Date  . Coronary artery bypass graft 1994  . Permanent pacemaker 04/09/06    dual chamber permanent pacemaker  . Tonsillectomy     childhood  . Pilonidal cystectomy      Family History  Problem Relation Age of Onset  . Colon cancer Neg Hx   . Prostate cancer Neg Hx      History   Social History  . Marital Status: Married    Spouse Name: N/A    Number of Children: N/A  . Years of Education: N/A   Occupational History  . retired Airline pilot for [employer]    Social History Main Topics  . Smoking status: Former Games developer  . Smokeless tobacco: Never Used  . Alcohol Use: No  . Drug Use: No  . Sexually Active: Not on file   Other Topics Concern  . Not on file   Social History Narrative    Has a living will- wife or attorney [name] to have health care POA. Would accept resuscitation but no  prolonged machines, not sure about feeding tube.     BP 132/72  Pulse 62  Ht 5\' 6"  (1.676 m)  Wt 67.949 kg (149 lb 12.8 oz)  BMI 24.18 kg/m2  Physical Exam:  Well appearing elderly man, NAD HEENT: Unremarkable Neck:  No JVD, no thyromegally Lungs:  Clear with no wheezes, rales, or rhonchi. Well-healed pacemaker incision. HEART:  Regular rate rhythm, no murmurs, no rubs, no clicks Abd:  soft, positive bowel sounds, no organomegally, no rebound, no guarding Ext:  2 plus pulses, no edema, no cyanosis, no clubbing Skin:  No rashes no nodules Neuro:  CN II through XII intact, motor grossly intact  DEVICE  Normal device function.  See PaceArt for details.   Assess/Plan:

## 2011-05-13 NOTE — Patient Instructions (Signed)
No medication changes.  Your physician wants you to follow-up in: 1 year with Dr. Court Joy will receive a reminder letter in the mail two months in advance. If you don't receive a letter, please call our office to schedule the follow-up appointment.  Your physician wants you to follow-up with Gunnar Fusi for a device check in: 6 months You will receive a reminder letter in the mail two months in advance. If you don't receive a letter, please call our office to schedule the follow-up appointment.

## 2011-05-13 NOTE — Assessment & Plan Note (Signed)
He is currently maintaining sinus rhythm. He will continue his current medical therapy.

## 2011-05-15 ENCOUNTER — Ambulatory Visit: Payer: Medicare Other | Admitting: Internal Medicine

## 2011-05-22 ENCOUNTER — Ambulatory Visit: Payer: Medicare Other | Admitting: Internal Medicine

## 2011-05-22 ENCOUNTER — Ambulatory Visit: Payer: Medicare Other

## 2011-05-25 ENCOUNTER — Ambulatory Visit (INDEPENDENT_AMBULATORY_CARE_PROVIDER_SITE_OTHER): Payer: Medicare Other | Admitting: Internal Medicine

## 2011-05-25 DIAGNOSIS — Z5181 Encounter for therapeutic drug level monitoring: Secondary | ICD-10-CM

## 2011-05-25 DIAGNOSIS — I4891 Unspecified atrial fibrillation: Secondary | ICD-10-CM

## 2011-05-25 DIAGNOSIS — Z7901 Long term (current) use of anticoagulants: Secondary | ICD-10-CM

## 2011-05-25 LAB — POCT INR: INR: 2.3

## 2011-05-25 NOTE — Patient Instructions (Signed)
Continue current dose, check in 4 weeks  

## 2011-05-27 ENCOUNTER — Encounter: Payer: Self-pay | Admitting: Internal Medicine

## 2011-05-27 ENCOUNTER — Ambulatory Visit (INDEPENDENT_AMBULATORY_CARE_PROVIDER_SITE_OTHER): Payer: Medicare Other | Admitting: Internal Medicine

## 2011-05-27 DIAGNOSIS — E119 Type 2 diabetes mellitus without complications: Secondary | ICD-10-CM

## 2011-05-27 DIAGNOSIS — E785 Hyperlipidemia, unspecified: Secondary | ICD-10-CM

## 2011-05-27 DIAGNOSIS — I4891 Unspecified atrial fibrillation: Secondary | ICD-10-CM

## 2011-05-27 DIAGNOSIS — N400 Enlarged prostate without lower urinary tract symptoms: Secondary | ICD-10-CM

## 2011-05-27 DIAGNOSIS — I10 Essential (primary) hypertension: Secondary | ICD-10-CM

## 2011-05-27 NOTE — Assessment & Plan Note (Signed)
Voids okay on the saw palmetto 

## 2011-05-27 NOTE — Assessment & Plan Note (Signed)
BP Readings from Last 3 Encounters:  05/27/11 138/70  05/13/11 132/72  04/08/11 120/64   Good control Lab Results  Component Value Date   CREATININE 0.9 11/05/2010

## 2011-05-27 NOTE — Assessment & Plan Note (Signed)
Still in regular rhythm with occ extra beats On coumadin

## 2011-05-27 NOTE — Patient Instructions (Signed)
Please let us know if you would like to get the shingles vaccine

## 2011-05-27 NOTE — Assessment & Plan Note (Signed)
Lab Results  Component Value Date   HGBA1C 5.9 04/29/2011   Great control which is what he prefers Discussed that if he has any sig hypoglycemia, we should stop the glimepiride

## 2011-05-27 NOTE — Progress Notes (Signed)
Subjective:    Patient ID: [name], male    DOB: 1918-01-26, 78 y.o.   MRN: [id number]  HPI Doing well Stress with wife just out of hospital Got pacer and got procedural pneumothorax Had chest tube but no further surgery needed  Has had recent cardiology evals No changes or problems  Still has back pain Uses alcohol and oil of wintergreen rubs Tylenol also helps  Voids okay Uses the saw palmetto Nocturia x 2  Walks regularly though missed some with wife in hospital No chest pain No DOE---still with good exercise tolerance  Checks sugars tid still He is comforted by frequent checks Usually under 100 when fasting No low sugar reactions that were significant (rare mild ones if misses meal)  Current Outpatient Prescriptions on File Prior to Visit  Medication Sig Dispense Refill  . amLODipine (NORVASC) 10 MG tablet TAKE ONE TABLET BY MOUTH ONE TIME DAILY  90 tablet  1  . Ascorbic Acid (VITAMIN C) 1000 MG tablet Take 1,000 mg by mouth daily.        Marland Kitchen aspirin 81 MG tablet Take 81 mg by mouth daily.        . B Complex-C-E-Zn (BEC/ZINC) TABS Take 1 tablet by mouth daily.        Marland Kitchen glimepiride (AMARYL) 1 MG tablet TAKE ONE TABLET BY MOUTH ONE TIME DAILY  90 tablet  1  . glucose blood test strip 1 each by Other route as needed. Use as instructed. Test daily.       Marland Kitchen NIASPAN 500 MG CR tablet TAKE ONE TABLET BY MOUTH ONE TIME DAILY  90 each  3  . nitroGLYCERIN (NITROSTAT) 0.4 MG SL tablet Place 0.4 mg under the tongue every 5 (five) minutes as needed.        . rosuvastatin (CRESTOR) 20 MG tablet Take 20 mg by mouth daily. Take 1/2 daily      . warfarin (COUMADIN) 5 MG tablet TAKE/USE PER MANUFACTURER INSTRUCTIONS OR DIRECTIONS FROM PHYSICIAN  120 tablet  1  . Zn-Pyg Afri-Nettle-Saw Palmet (SAW PALMETTO COMPLEX) CAPS Take 1 capsule by mouth daily.          Allergies  Allergen Reactions  . Cefuroxime Axetil     REACTION: unspecified  . Heparin     REACTION: rash    Past  Medical History  Diagnosis Date  . CAD (coronary artery disease) 1974    Dr. Riley Kill   . DM2 (diabetes mellitus, type 2) 2003  . HTN (hypertension)   . BPH (benign prostatic hypertrophy)   . Thyroid cyst     incidental finding on ultra sound  . Atrial fibrillation   . Hyperlipidemia   . SSS (sick sinus syndrome)     Past Surgical History  Procedure Date  . Coronary artery bypass graft 1994  . Permanent pacemaker 04/09/06    dual chamber permanent pacemaker  . Tonsillectomy     childhood  . Pilonidal cystectomy     Family History  Problem Relation Age of Onset  . Colon cancer Neg Hx   . Prostate cancer Neg Hx     History   Social History  . Marital Status: Married    Spouse Name: N/A    Number of Children: N/A  . Years of Education: N/A   Occupational History  . retired Airline pilot for [employer]    Social History Main Topics  . Smoking status: Former Games developer  . Smokeless tobacco: Never Used  . Alcohol Use:  No  . Drug Use: No  . Sexually Active: Not on file   Other Topics Concern  . Not on file   Social History Narrative    Has a living will- wife or attorney [name] to have health care POA. Would accept resuscitation but no prolonged machines, not sure about feeding tube.   Review of Systems Sleeps fairly well Appetite is good Weight is stable    Objective:   Physical Exam  Constitutional: He appears well-developed and well-nourished. No distress.  Neck: Normal range of motion. Neck supple. No thyromegaly present.  Cardiovascular: Normal rate, regular rhythm, normal heart sounds and intact distal pulses.  Exam reveals no gallop.   No murmur heard. Pulmonary/Chest: Effort normal and breath sounds normal. No respiratory distress. He has no wheezes. He has no rales.  Musculoskeletal: He exhibits no edema and no tenderness.  Lymphadenopathy:    He has no cervical adenopathy.  Skin: No rash noted. No erythema.       No foot lesions  Psychiatric: He  has a normal mood and affect. His behavior is normal. Judgment and thought content normal.          Assessment & Plan:

## 2011-05-27 NOTE — Assessment & Plan Note (Signed)
Lab Results  Component Value Date   LDLCALC 74 04/29/2011   Excellent control No changes needed

## 2011-06-11 ENCOUNTER — Other Ambulatory Visit: Payer: Self-pay | Admitting: Internal Medicine

## 2011-06-22 ENCOUNTER — Ambulatory Visit (INDEPENDENT_AMBULATORY_CARE_PROVIDER_SITE_OTHER): Payer: Medicare Other | Admitting: Internal Medicine

## 2011-06-22 DIAGNOSIS — Z5181 Encounter for therapeutic drug level monitoring: Secondary | ICD-10-CM

## 2011-06-22 DIAGNOSIS — I4891 Unspecified atrial fibrillation: Secondary | ICD-10-CM

## 2011-06-22 DIAGNOSIS — Z7901 Long term (current) use of anticoagulants: Secondary | ICD-10-CM

## 2011-06-22 LAB — POCT INR: INR: 3.1

## 2011-06-22 NOTE — Patient Instructions (Signed)
Continue current dose, check in 4 weeks  

## 2011-06-30 ENCOUNTER — Other Ambulatory Visit: Payer: Self-pay | Admitting: Internal Medicine

## 2011-07-22 ENCOUNTER — Ambulatory Visit (INDEPENDENT_AMBULATORY_CARE_PROVIDER_SITE_OTHER): Payer: Medicare Other | Admitting: Internal Medicine

## 2011-07-22 DIAGNOSIS — Z5181 Encounter for therapeutic drug level monitoring: Secondary | ICD-10-CM

## 2011-07-22 DIAGNOSIS — Z7901 Long term (current) use of anticoagulants: Secondary | ICD-10-CM

## 2011-07-22 DIAGNOSIS — I25810 Atherosclerosis of coronary artery bypass graft(s) without angina pectoris: Secondary | ICD-10-CM

## 2011-07-22 DIAGNOSIS — I4891 Unspecified atrial fibrillation: Secondary | ICD-10-CM

## 2011-07-22 LAB — POCT INR: INR: 1.9

## 2011-07-22 NOTE — Patient Instructions (Signed)
Continue current dose, check in 4 weeks  

## 2011-08-10 ENCOUNTER — Ambulatory Visit (INDEPENDENT_AMBULATORY_CARE_PROVIDER_SITE_OTHER): Payer: Medicare Other | Admitting: Internal Medicine

## 2011-08-10 DIAGNOSIS — I4891 Unspecified atrial fibrillation: Secondary | ICD-10-CM

## 2011-08-10 DIAGNOSIS — Z7901 Long term (current) use of anticoagulants: Secondary | ICD-10-CM

## 2011-08-10 DIAGNOSIS — Z5181 Encounter for therapeutic drug level monitoring: Secondary | ICD-10-CM

## 2011-08-10 DIAGNOSIS — I25810 Atherosclerosis of coronary artery bypass graft(s) without angina pectoris: Secondary | ICD-10-CM

## 2011-08-10 LAB — POCT INR: INR: 2.2

## 2011-08-10 NOTE — Patient Instructions (Addendum)
Continue 2.5 mg daily, 5 mg Tue,Sat recheck 4 weeks 

## 2011-08-19 ENCOUNTER — Ambulatory Visit: Payer: Medicare Other

## 2011-09-09 ENCOUNTER — Ambulatory Visit (INDEPENDENT_AMBULATORY_CARE_PROVIDER_SITE_OTHER): Payer: Medicare Other | Admitting: Internal Medicine

## 2011-09-09 DIAGNOSIS — I4891 Unspecified atrial fibrillation: Secondary | ICD-10-CM

## 2011-09-09 DIAGNOSIS — Z5181 Encounter for therapeutic drug level monitoring: Secondary | ICD-10-CM

## 2011-09-09 DIAGNOSIS — Z7901 Long term (current) use of anticoagulants: Secondary | ICD-10-CM

## 2011-09-09 DIAGNOSIS — I25810 Atherosclerosis of coronary artery bypass graft(s) without angina pectoris: Secondary | ICD-10-CM

## 2011-09-09 NOTE — Patient Instructions (Addendum)
Continue 2.5 mg daily, 5 mg Tue,Sat recheck 4 weeks 

## 2011-09-21 ENCOUNTER — Other Ambulatory Visit: Payer: Self-pay | Admitting: *Deleted

## 2011-09-21 MED ORDER — NIACIN ER (ANTIHYPERLIPIDEMIC) 500 MG PO TBCR: 500.0000 mg | EXTENDED_RELEASE_TABLET | Freq: Every day | ORAL | Status: DC

## 2011-09-21 MED ORDER — WARFARIN SODIUM 5 MG PO TABS: 5.0000 mg | ORAL_TABLET | ORAL | Status: DC

## 2011-10-07 ENCOUNTER — Ambulatory Visit (INDEPENDENT_AMBULATORY_CARE_PROVIDER_SITE_OTHER): Payer: Medicare Other | Admitting: Internal Medicine

## 2011-10-07 ENCOUNTER — Ambulatory Visit: Payer: Medicare Other | Admitting: Cardiology

## 2011-10-07 DIAGNOSIS — E119 Type 2 diabetes mellitus without complications: Secondary | ICD-10-CM

## 2011-10-07 DIAGNOSIS — Z7901 Long term (current) use of anticoagulants: Secondary | ICD-10-CM

## 2011-10-07 DIAGNOSIS — I25810 Atherosclerosis of coronary artery bypass graft(s) without angina pectoris: Secondary | ICD-10-CM

## 2011-10-07 DIAGNOSIS — Z5181 Encounter for therapeutic drug level monitoring: Secondary | ICD-10-CM

## 2011-10-07 DIAGNOSIS — I4891 Unspecified atrial fibrillation: Secondary | ICD-10-CM

## 2011-10-07 DIAGNOSIS — E785 Hyperlipidemia, unspecified: Secondary | ICD-10-CM

## 2011-10-07 LAB — POCT INR: INR: 3

## 2011-10-07 LAB — LIPID PANEL
Cholesterol: 144 mg/dL (ref 0–200)
LDL Cholesterol: 74 mg/dL (ref 0–99)
Triglycerides: 52 mg/dL (ref 0.0–149.0)
VLDL: 10.4 mg/dL (ref 0.0–40.0)

## 2011-10-07 NOTE — Patient Instructions (Addendum)
Continue 2.5 mg daily, 5 mg Tue,Sat recheck 4 weeks

## 2011-10-08 ENCOUNTER — Ambulatory Visit: Payer: Medicare Other | Admitting: Cardiology

## 2011-10-12 ENCOUNTER — Encounter: Payer: Self-pay | Admitting: *Deleted

## 2011-10-19 ENCOUNTER — Encounter: Payer: Self-pay | Admitting: Cardiology

## 2011-10-19 ENCOUNTER — Ambulatory Visit (INDEPENDENT_AMBULATORY_CARE_PROVIDER_SITE_OTHER): Payer: Medicare Other | Admitting: Cardiology

## 2011-10-19 VITALS — BP 122/64 | HR 60 | Ht 66.0 in | Wt 148.0 lb

## 2011-10-19 DIAGNOSIS — I4891 Unspecified atrial fibrillation: Secondary | ICD-10-CM

## 2011-10-19 DIAGNOSIS — E785 Hyperlipidemia, unspecified: Secondary | ICD-10-CM

## 2011-10-19 DIAGNOSIS — I2510 Atherosclerotic heart disease of native coronary artery without angina pectoris: Secondary | ICD-10-CM

## 2011-10-19 DIAGNOSIS — I25810 Atherosclerosis of coronary artery bypass graft(s) without angina pectoris: Secondary | ICD-10-CM

## 2011-10-19 NOTE — Assessment & Plan Note (Signed)
On warfarin.

## 2011-10-19 NOTE — Assessment & Plan Note (Signed)
No recurrent angina 

## 2011-10-19 NOTE — Assessment & Plan Note (Signed)
Near target.  

## 2011-10-19 NOTE — Patient Instructions (Addendum)
Your physician wants you to follow-up in: 6 MONTHS with Dr Stuckey.  You will receive a reminder letter in the mail two months in advance. If you don't receive a letter, please call our office to schedule the follow-up appointment.  Your physician recommends that you continue on your current medications as directed. Please refer to the Current Medication list given to you today.  

## 2011-10-19 NOTE — Progress Notes (Signed)
HPI:  Patient is in for followup. In general he is doing pretty well. He has no specific complaints. Continue to follow him closely. He is now 19 years following revascularization surgery his recent lipid profile revealed him to be near target. Is being followed by Dr. Alphonsus Sias.  Current Outpatient Prescriptions  Medication Sig Dispense Refill  . amLODipine (NORVASC) 10 MG tablet TAKE ONE TABLET BY MOUTH ONE TIME DAILY  90 tablet  1  . Ascorbic Acid (VITAMIN C) 1000 MG tablet Take 1,000 mg by mouth daily.        Marland Kitchen aspirin 81 MG tablet Take 81 mg by mouth daily.        . B Complex-C-E-Zn (BEC/ZINC) TABS Take 1 tablet by mouth daily.        Marland Kitchen glimepiride (AMARYL) 1 MG tablet TAKE ONE TABLET BY MOUTH ONE TIME DAILY  90 tablet  0  . niacin (NIASPAN) 500 MG CR tablet Take 1 tablet (500 mg total) by mouth at bedtime.  30 tablet  3  . nitroGLYCERIN (NITROSTAT) 0.4 MG SL tablet Place 0.4 mg under the tongue every 5 (five) minutes as needed.        . ONE TOUCH ULTRA TEST test strip TEST ONCE OR TWICE DAILY  200 each  10  . rosuvastatin (CRESTOR) 20 MG tablet Take 20 mg by mouth daily. Take 1/2 daily      . warfarin (COUMADIN) 5 MG tablet Take 1 tablet (5 mg total) by mouth as directed.  120 tablet  1  . Zn-Pyg Afri-Nettle-Saw Palmet (SAW PALMETTO COMPLEX) CAPS Take 1 capsule by mouth daily.          Allergies  Allergen Reactions  . Cefuroxime Axetil     REACTION: unspecified  . Heparin     REACTION: rash    Past Medical History  Diagnosis Date  . CAD (coronary artery disease) 1974    Dr. Riley Kill   . DM2 (diabetes mellitus, type 2) 2003  . HTN (hypertension)   . BPH (benign prostatic hypertrophy)   . Thyroid cyst     incidental finding on ultra sound  . Atrial fibrillation   . Hyperlipidemia   . SSS (sick sinus syndrome)     Past Surgical History  Procedure Date  . Coronary artery bypass graft 1994  . Permanent pacemaker 04/09/06    dual chamber permanent pacemaker  . Tonsillectomy      childhood  . Pilonidal cystectomy     Family History  Problem Relation Age of Onset  . Colon cancer Neg Hx   . Prostate cancer Neg Hx     History   Social History  . Marital Status: Married    Spouse Name: N/A    Number of Children: N/A  . Years of Education: N/A   Occupational History  . retired Airline pilot for [employer]    Social History Main Topics  . Smoking status: Former Games developer  . Smokeless tobacco: Never Used  . Alcohol Use: No  . Drug Use: No  . Sexually Active: Not on file   Other Topics Concern  . Not on file   Social History Narrative    Has a living will- wife or attorney [name] to have health care POA. Would accept resuscitation but no prolonged machines, not sure about feeding tube.    ROS: Please see the HPI.  All other systems reviewed and negative.  PHYSICAL EXAM:  BP 122/64  Pulse 60  Ht 5\' 6"  (  1.676 m)  Wt 148 lb (67.132 kg)  BMI 23.89 kg/m2  General: Well developed, well nourished, in no acute distress. Head:  Normocephalic and atraumatic. Neck: no JVD Lungs: Clear to auscultation and percussion. Heart: Normal S1 and S2.  No murmur, rubs or gallops.  Pulses: Pulses normal in all 4 extremities. Extremities: No clubbing or cyanosis. No edema. Neurologic: Alert and oriented x 3.  EKG:  NSR.  Some atrial pacing  ASSESSMENT AND PLAN:

## 2011-10-19 NOTE — Progress Notes (Signed)
HPI:  Current Outpatient Prescriptions  Medication Sig Dispense Refill  . amLODipine (NORVASC) 10 MG tablet TAKE ONE TABLET BY MOUTH ONE TIME DAILY  90 tablet  1  . Ascorbic Acid (VITAMIN C) 1000 MG tablet Take 1,000 mg by mouth daily.        Marland Kitchen aspirin 81 MG tablet Take 81 mg by mouth daily.        . B Complex-C-E-Zn (BEC/ZINC) TABS Take 1 tablet by mouth daily.        Marland Kitchen glimepiride (AMARYL) 1 MG tablet TAKE ONE TABLET BY MOUTH ONE TIME DAILY  90 tablet  0  . niacin (NIASPAN) 500 MG CR tablet Take 1 tablet (500 mg total) by mouth at bedtime.  30 tablet  3  . nitroGLYCERIN (NITROSTAT) 0.4 MG SL tablet Place 0.4 mg under the tongue every 5 (five) minutes as needed.        . ONE TOUCH ULTRA TEST test strip TEST ONCE OR TWICE DAILY  200 each  10  . rosuvastatin (CRESTOR) 20 MG tablet Take 20 mg by mouth daily. Take 1/2 daily      . warfarin (COUMADIN) 5 MG tablet Take 1 tablet (5 mg total) by mouth as directed.  120 tablet  1  . Zn-Pyg Afri-Nettle-Saw Palmet (SAW PALMETTO COMPLEX) CAPS Take 1 capsule by mouth daily.          Allergies  Allergen Reactions  . Cefuroxime Axetil     REACTION: unspecified  . Heparin     REACTION: rash    Past Medical History  Diagnosis Date  . CAD (coronary artery disease) 1974    Dr. Riley Kill   . DM2 (diabetes mellitus, type 2) 2003  . HTN (hypertension)   . BPH (benign prostatic hypertrophy)   . Thyroid cyst     incidental finding on ultra sound  . Atrial fibrillation   . Hyperlipidemia   . SSS (sick sinus syndrome)     Past Surgical History  Procedure Date  . Coronary artery bypass graft 1994  . Permanent pacemaker 04/09/06    dual chamber permanent pacemaker  . Tonsillectomy     childhood  . Pilonidal cystectomy     Family History  Problem Relation Age of Onset  . Colon cancer Neg Hx   . Prostate cancer Neg Hx     History   Social History  . Marital Status: Married    Spouse Name: N/A    Number of Children: N/A  . Years of  Education: N/A   Occupational History  . retired Airline pilot for [employer]    Social History Main Topics  . Smoking status: Former Games developer  . Smokeless tobacco: Never Used  . Alcohol Use: No  . Drug Use: No  . Sexually Active: Not on file   Other Topics Concern  . Not on file   Social History Narrative    Has a living will- wife or attorney [name] to have health care POA. Would accept resuscitation but no prolonged machines, not sure about feeding tube.    ROS: Please see the HPI.  All other systems reviewed and negative.  PHYSICAL EXAM:  BP 122/64  Pulse 60  Ht 5\' 6"  (1.676 m)  Wt 148 lb (67.132 kg)  BMI 23.89 kg/m2  General: Well developed, well nourished, in no acute distress. Head:  Normocephalic and atraumatic. Neck: no JVD Lungs: Clear to auscultation and percussion. Heart: Normal S1 and S2.  No murmur, rubs or gallops.  Abdomen:  Normal bowel sounds; soft; non tender; no organomegaly Pulses: Pulses normal in all 4 extremities. Extremities: No clubbing or cyanosis. No edema. Neurologic: Alert and oriented x 3.  EKG:  ASSESSMENT AND PLAN:

## 2011-10-29 ENCOUNTER — Other Ambulatory Visit: Payer: Self-pay | Admitting: *Deleted

## 2011-10-29 MED ORDER — AMLODIPINE BESYLATE 10 MG PO TABS: 10.0000 mg | ORAL_TABLET | Freq: Every day | ORAL | Status: DC

## 2011-11-04 ENCOUNTER — Ambulatory Visit (INDEPENDENT_AMBULATORY_CARE_PROVIDER_SITE_OTHER): Payer: Medicare Other | Admitting: Family Medicine

## 2011-11-04 DIAGNOSIS — I25810 Atherosclerosis of coronary artery bypass graft(s) without angina pectoris: Secondary | ICD-10-CM

## 2011-11-04 DIAGNOSIS — Z7901 Long term (current) use of anticoagulants: Secondary | ICD-10-CM

## 2011-11-04 DIAGNOSIS — I4891 Unspecified atrial fibrillation: Secondary | ICD-10-CM

## 2011-11-04 DIAGNOSIS — Z5181 Encounter for therapeutic drug level monitoring: Secondary | ICD-10-CM

## 2011-11-04 LAB — POCT INR: INR: 2.9

## 2011-11-04 NOTE — Patient Instructions (Signed)
Continue 2.5 mg daily, 5 mg Tue,Sat recheck 4 weeks( will eat more greens to lower INR, asked him not to hold doses. He wants to increase greens instead of reducing meds)

## 2011-11-16 ENCOUNTER — Ambulatory Visit (INDEPENDENT_AMBULATORY_CARE_PROVIDER_SITE_OTHER): Payer: Medicare Other | Admitting: *Deleted

## 2011-11-16 ENCOUNTER — Encounter: Payer: Self-pay | Admitting: Internal Medicine

## 2011-11-16 DIAGNOSIS — I495 Sick sinus syndrome: Secondary | ICD-10-CM

## 2011-11-16 LAB — PACEMAKER DEVICE OBSERVATION
AL AMPLITUDE: 2.9 mv
AL IMPEDENCE PM: 430 Ohm
ATRIAL PACING PM: 77
BAMS-0003: 70 {beats}/min
RV LEAD IMPEDENCE PM: 460 Ohm
RV LEAD THRESHOLD: 0.5 V

## 2011-11-16 NOTE — Progress Notes (Signed)
PPM check 

## 2011-11-18 ENCOUNTER — Encounter: Payer: Self-pay | Admitting: Internal Medicine

## 2011-11-18 ENCOUNTER — Ambulatory Visit (INDEPENDENT_AMBULATORY_CARE_PROVIDER_SITE_OTHER): Payer: Medicare Other | Admitting: Internal Medicine

## 2011-11-18 VITALS — BP 138/68 | HR 60 | Temp 98.4°F | Ht 66.0 in | Wt 148.0 lb

## 2011-11-18 DIAGNOSIS — I2510 Atherosclerotic heart disease of native coronary artery without angina pectoris: Secondary | ICD-10-CM

## 2011-11-18 DIAGNOSIS — E119 Type 2 diabetes mellitus without complications: Secondary | ICD-10-CM

## 2011-11-18 DIAGNOSIS — I4891 Unspecified atrial fibrillation: Secondary | ICD-10-CM

## 2011-11-18 DIAGNOSIS — E785 Hyperlipidemia, unspecified: Secondary | ICD-10-CM

## 2011-11-18 DIAGNOSIS — N400 Enlarged prostate without lower urinary tract symptoms: Secondary | ICD-10-CM

## 2011-11-18 LAB — HEPATIC FUNCTION PANEL
ALT: 15 U/L (ref 0–53)
Albumin: 4.2 g/dL (ref 3.5–5.2)
Bilirubin, Direct: 0.1 mg/dL (ref 0.0–0.3)
Total Protein: 7.2 g/dL (ref 6.0–8.3)

## 2011-11-18 LAB — BASIC METABOLIC PANEL
CO2: 26 mEq/L (ref 19–32)
Chloride: 104 mEq/L (ref 96–112)
Glucose, Bld: 65 mg/dL — ABNORMAL LOW (ref 70–99)
Potassium: 4.3 mEq/L (ref 3.5–5.1)
Sodium: 138 mEq/L (ref 135–145)

## 2011-11-18 LAB — CBC WITH DIFFERENTIAL/PLATELET
Basophils Absolute: 0 10*3/uL (ref 0.0–0.1)
Basophils Relative: 0.3 % (ref 0.0–3.0)
Eosinophils Absolute: 0.1 10*3/uL (ref 0.0–0.7)
HCT: 43.6 % (ref 39.0–52.0)
Hemoglobin: 14.3 g/dL (ref 13.0–17.0)
Lymphs Abs: 2.3 10*3/uL (ref 0.7–4.0)
MCHC: 32.8 g/dL (ref 30.0–36.0)
MCV: 104.8 fl — ABNORMAL HIGH (ref 78.0–100.0)
Neutro Abs: 2.6 10*3/uL (ref 1.4–7.7)
RBC: 4.16 Mil/uL — ABNORMAL LOW (ref 4.22–5.81)
RDW: 13.4 % (ref 11.5–14.6)

## 2011-11-18 NOTE — Assessment & Plan Note (Signed)
Has been quiet Good exercise tolerance No changes needed

## 2011-11-18 NOTE — Progress Notes (Signed)
Subjective:    Patient ID: [name], male    DOB: May 18, 1917, 78 y.o.   MRN: [id number]  HPI Doing well Recent cardiology eval was negative  Remains active No chest pain No dyspnea No sig edema No dizziness or syncope  Checks sugars tid still--he prefers the frequent checks Generally under 110 fasting occ over 200 postprandial No hypoglycemic reactions  Voids slowly but okay Nocturia x 2 is stable Current Outpatient Prescriptions on File Prior to Visit  Medication Sig Dispense Refill  . amLODipine (NORVASC) 10 MG tablet Take 1 tablet (10 mg total) by mouth daily.  90 tablet  1  . Ascorbic Acid (VITAMIN C) 1000 MG tablet Take 1,000 mg by mouth daily.        Marland Kitchen aspirin 81 MG tablet Take 81 mg by mouth daily.        . B Complex-C-E-Zn (BEC/ZINC) TABS Take 1 tablet by mouth daily.        Marland Kitchen glimepiride (AMARYL) 1 MG tablet TAKE ONE TABLET BY MOUTH ONE TIME DAILY  90 tablet  0  . niacin (NIASPAN) 500 MG CR tablet Take 1 tablet (500 mg total) by mouth at bedtime.  30 tablet  3  . nitroGLYCERIN (NITROSTAT) 0.4 MG SL tablet Place 0.4 mg under the tongue every 5 (five) minutes as needed.        . ONE TOUCH ULTRA TEST test strip TEST ONCE OR TWICE DAILY  200 each  10  . rosuvastatin (CRESTOR) 20 MG tablet Take 20 mg by mouth daily. Take 1/2 daily      . warfarin (COUMADIN) 5 MG tablet Take 1 tablet (5 mg total) by mouth as directed.  120 tablet  1  . Zn-Pyg Afri-Nettle-Saw Palmet (SAW PALMETTO COMPLEX) CAPS Take 1 capsule by mouth daily.          Allergies  Allergen Reactions  . Cefuroxime Axetil     REACTION: unspecified  . Heparin     REACTION: rash    Past Medical History  Diagnosis Date  . CAD (coronary artery disease) 1974    Dr. Riley Kill   . DM2 (diabetes mellitus, type 2) 2003  . HTN (hypertension)   . BPH (benign prostatic hypertrophy)   . Thyroid cyst     incidental finding on ultra sound  . Atrial fibrillation   . Hyperlipidemia   . SSS (sick sinus syndrome)      Past Surgical History  Procedure Date  . Coronary artery bypass graft 1994  . Permanent pacemaker 04/09/06    dual chamber permanent pacemaker  . Tonsillectomy     childhood  . Pilonidal cystectomy     Family History  Problem Relation Age of Onset  . Colon cancer Neg Hx   . Prostate cancer Neg Hx     History   Social History  . Marital Status: Married    Spouse Name: N/A    Number of Children: N/A  . Years of Education: N/A   Occupational History  . retired Airline pilot for [employer]    Social History Main Topics  . Smoking status: Former Games developer  . Smokeless tobacco: Never Used  . Alcohol Use: No  . Drug Use: No  . Sexually Active: Not on file   Other Topics Concern  . Not on file   Social History Narrative    Has a living will- wife or attorney [name] to have health care POA. Would accept resuscitation but no prolonged machines, not sure  about feeding tube.   Review of Systems Appetite is great Weight stable Sleeps well overall    Objective:   Physical Exam  Constitutional: He appears well-developed and well-nourished. No distress.  Neck: Normal range of motion. Neck supple. No thyromegaly present.  Cardiovascular: Normal rate, normal heart sounds and intact distal pulses.  Exam reveals no gallop.   No murmur heard.      irregular  Pulmonary/Chest: Effort normal and breath sounds normal. No respiratory distress. He has no wheezes. He has no rales.  Abdominal: Soft. There is no tenderness.  Musculoskeletal: He exhibits no tenderness.       Trace ankle edema  Lymphadenopathy:    He has no cervical adenopathy.  Psychiatric: He has a normal mood and affect. His behavior is normal. Thought content normal.          Assessment & Plan:

## 2011-11-18 NOTE — Assessment & Plan Note (Signed)
Good rate control On coumadin 

## 2011-11-18 NOTE — Assessment & Plan Note (Signed)
Lab Results  Component Value Date   LDLCALC 74 10/07/2011   At goal

## 2011-11-18 NOTE — Assessment & Plan Note (Signed)
Voiding okay without Rx

## 2011-11-18 NOTE — Assessment & Plan Note (Signed)
Lab Results  Component Value Date   HGBA1C 6.1 10/07/2011   Very good control No changes needed

## 2011-11-19 ENCOUNTER — Encounter: Payer: Self-pay | Admitting: *Deleted

## 2011-12-02 ENCOUNTER — Ambulatory Visit (INDEPENDENT_AMBULATORY_CARE_PROVIDER_SITE_OTHER): Payer: Medicare Other | Admitting: Internal Medicine

## 2011-12-02 DIAGNOSIS — I4891 Unspecified atrial fibrillation: Secondary | ICD-10-CM

## 2011-12-02 DIAGNOSIS — Z5181 Encounter for therapeutic drug level monitoring: Secondary | ICD-10-CM

## 2011-12-02 DIAGNOSIS — I25810 Atherosclerosis of coronary artery bypass graft(s) without angina pectoris: Secondary | ICD-10-CM

## 2011-12-02 DIAGNOSIS — Z7901 Long term (current) use of anticoagulants: Secondary | ICD-10-CM

## 2011-12-02 NOTE — Patient Instructions (Signed)
Continue 2.5 mg daily, 5 mg Tue,(reduced by 2.5 mg)

## 2011-12-04 ENCOUNTER — Other Ambulatory Visit: Payer: Self-pay | Admitting: *Deleted

## 2011-12-04 MED ORDER — GLIMEPIRIDE 1 MG PO TABS: 1.0000 mg | ORAL_TABLET | Freq: Every day | ORAL | Status: DC

## 2011-12-16 ENCOUNTER — Ambulatory Visit (INDEPENDENT_AMBULATORY_CARE_PROVIDER_SITE_OTHER): Payer: Medicare Other | Admitting: Internal Medicine

## 2011-12-16 DIAGNOSIS — Z7901 Long term (current) use of anticoagulants: Secondary | ICD-10-CM

## 2011-12-16 DIAGNOSIS — I25810 Atherosclerosis of coronary artery bypass graft(s) without angina pectoris: Secondary | ICD-10-CM

## 2011-12-16 DIAGNOSIS — I4891 Unspecified atrial fibrillation: Secondary | ICD-10-CM

## 2011-12-16 DIAGNOSIS — Z5181 Encounter for therapeutic drug level monitoring: Secondary | ICD-10-CM

## 2011-12-16 LAB — POCT INR: INR: 2.3

## 2011-12-16 NOTE — Patient Instructions (Signed)
Continue 2.5 mg daily, 5 mg Tue recheck 4 weeks  

## 2012-01-04 ENCOUNTER — Telehealth: Payer: Self-pay

## 2012-01-04 NOTE — Telephone Encounter (Signed)
[name] with [employer] wanted documentation of pt being educated how to use glucose monitoring device; [name] said pt has been using a long time but [employer] has received a medicare audit. [name] said we do not have documentation since pt has been using for long time. [name] understood.

## 2012-01-06 ENCOUNTER — Other Ambulatory Visit: Payer: Self-pay | Admitting: *Deleted

## 2012-01-06 MED ORDER — GLUCOSE BLOOD VI STRP: ORAL_STRIP | Status: DC

## 2012-01-13 ENCOUNTER — Ambulatory Visit: Payer: Medicare Other

## 2012-01-15 ENCOUNTER — Other Ambulatory Visit: Payer: Self-pay | Admitting: *Deleted

## 2012-01-15 ENCOUNTER — Ambulatory Visit (INDEPENDENT_AMBULATORY_CARE_PROVIDER_SITE_OTHER): Payer: Medicare Other | Admitting: Internal Medicine

## 2012-01-15 ENCOUNTER — Ambulatory Visit (INDEPENDENT_AMBULATORY_CARE_PROVIDER_SITE_OTHER): Payer: Medicare Other

## 2012-01-15 DIAGNOSIS — Z7901 Long term (current) use of anticoagulants: Secondary | ICD-10-CM

## 2012-01-15 DIAGNOSIS — Z23 Encounter for immunization: Secondary | ICD-10-CM

## 2012-01-15 DIAGNOSIS — Z5181 Encounter for therapeutic drug level monitoring: Secondary | ICD-10-CM

## 2012-01-15 DIAGNOSIS — I25810 Atherosclerosis of coronary artery bypass graft(s) without angina pectoris: Secondary | ICD-10-CM

## 2012-01-15 DIAGNOSIS — I4891 Unspecified atrial fibrillation: Secondary | ICD-10-CM

## 2012-01-15 MED ORDER — NITROGLYCERIN 0.4 MG SL SUBL: 0.4000 mg | SUBLINGUAL_TABLET | SUBLINGUAL | Status: DC | PRN

## 2012-01-15 NOTE — Patient Instructions (Addendum)
Continue current dose, check in 4 weeks  

## 2012-02-12 ENCOUNTER — Ambulatory Visit (INDEPENDENT_AMBULATORY_CARE_PROVIDER_SITE_OTHER): Payer: Medicare Other | Admitting: Internal Medicine

## 2012-02-12 DIAGNOSIS — I25810 Atherosclerosis of coronary artery bypass graft(s) without angina pectoris: Secondary | ICD-10-CM

## 2012-02-12 DIAGNOSIS — I4891 Unspecified atrial fibrillation: Secondary | ICD-10-CM

## 2012-02-12 DIAGNOSIS — Z5181 Encounter for therapeutic drug level monitoring: Secondary | ICD-10-CM

## 2012-02-12 DIAGNOSIS — E119 Type 2 diabetes mellitus without complications: Secondary | ICD-10-CM

## 2012-02-12 DIAGNOSIS — Z7901 Long term (current) use of anticoagulants: Secondary | ICD-10-CM

## 2012-02-12 LAB — POCT INR: INR: 1.9

## 2012-02-12 NOTE — Patient Instructions (Signed)
Continue 2.5 mg daily, 5 mg Tue recheck 4 weeks  

## 2012-02-15 ENCOUNTER — Encounter: Payer: Self-pay | Admitting: *Deleted

## 2012-02-17 ENCOUNTER — Ambulatory Visit: Payer: Medicare Other

## 2012-03-09 ENCOUNTER — Other Ambulatory Visit (INDEPENDENT_AMBULATORY_CARE_PROVIDER_SITE_OTHER): Payer: Medicare Other

## 2012-03-09 DIAGNOSIS — Z5181 Encounter for therapeutic drug level monitoring: Secondary | ICD-10-CM

## 2012-03-09 DIAGNOSIS — I4891 Unspecified atrial fibrillation: Secondary | ICD-10-CM

## 2012-03-09 DIAGNOSIS — I25810 Atherosclerosis of coronary artery bypass graft(s) without angina pectoris: Secondary | ICD-10-CM

## 2012-03-09 DIAGNOSIS — Z7901 Long term (current) use of anticoagulants: Secondary | ICD-10-CM

## 2012-04-08 ENCOUNTER — Other Ambulatory Visit: Payer: Self-pay | Admitting: Internal Medicine

## 2012-04-08 ENCOUNTER — Telehealth: Payer: Self-pay

## 2012-04-08 MED ORDER — GLUCOSE BLOOD VI STRP: ORAL_STRIP | Status: DC

## 2012-04-08 NOTE — Telephone Encounter (Signed)
Hard copy faxed to [employer]

## 2012-04-08 NOTE — Telephone Encounter (Signed)
[facility] [employer] [place] left v/m needs either pt to bring rx to pharmacy or fax hard copy of prescription for diabetic test strips to [phone].Please advise.

## 2012-04-11 ENCOUNTER — Ambulatory Visit (INDEPENDENT_AMBULATORY_CARE_PROVIDER_SITE_OTHER): Payer: Medicare Other | Admitting: General Practice

## 2012-04-11 DIAGNOSIS — Z7901 Long term (current) use of anticoagulants: Secondary | ICD-10-CM

## 2012-04-11 DIAGNOSIS — I25810 Atherosclerosis of coronary artery bypass graft(s) without angina pectoris: Secondary | ICD-10-CM

## 2012-04-11 DIAGNOSIS — I4891 Unspecified atrial fibrillation: Secondary | ICD-10-CM

## 2012-04-11 LAB — POCT INR: INR: 1.9

## 2012-04-11 NOTE — Patient Instructions (Signed)
Take 1 tablet (5 mg) today and then continue 2.5 mg daily, 5 mg Tue recheck 4 weeks

## 2012-04-19 ENCOUNTER — Encounter: Payer: Self-pay | Admitting: Cardiology

## 2012-04-19 ENCOUNTER — Ambulatory Visit (INDEPENDENT_AMBULATORY_CARE_PROVIDER_SITE_OTHER): Payer: Medicare Other | Admitting: Cardiology

## 2012-04-19 VITALS — BP 120/67 | HR 61 | Ht 66.0 in | Wt 152.0 lb

## 2012-04-19 DIAGNOSIS — I4891 Unspecified atrial fibrillation: Secondary | ICD-10-CM

## 2012-04-19 DIAGNOSIS — I2510 Atherosclerotic heart disease of native coronary artery without angina pectoris: Secondary | ICD-10-CM

## 2012-04-19 DIAGNOSIS — I25810 Atherosclerosis of coronary artery bypass graft(s) without angina pectoris: Secondary | ICD-10-CM

## 2012-04-19 DIAGNOSIS — E785 Hyperlipidemia, unspecified: Secondary | ICD-10-CM

## 2012-04-19 NOTE — Assessment & Plan Note (Signed)
Paced and NSR.  On chronic warfarin therapy.

## 2012-04-19 NOTE — Patient Instructions (Addendum)
Your physician recommends that you schedule a follow-up appointment in: 3 months with Dr. Mariah Milling in [place].  Your physician recommends that you return for lab work in [place] for a Lipid and Liver Panel.

## 2012-04-19 NOTE — Progress Notes (Signed)
HPI:  The patient returned in follow up today.  He is stable.  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  No chest pain.    Current Outpatient Prescriptions  Medication Sig Dispense Refill  . amLODipine (NORVASC) 10 MG tablet Take 1 tablet (10 mg total) by mouth daily.  90 tablet  1  . Ascorbic Acid (VITAMIN C) 1000 MG tablet Take 1,000 mg by mouth daily.        Marland Kitchen aspirin 81 MG tablet Take 81 mg by mouth daily.        . B Complex-C-E-Zn (BEC/ZINC) TABS Take 1 tablet by mouth daily.        Marland Kitchen glimepiride (AMARYL) 1 MG tablet Take 1 tablet (1 mg total) by mouth daily before breakfast.  90 tablet  3  . glucose blood (ONE TOUCH ULTRA TEST) test strip Use as instructed to test blood sugar once or twice daily, dx: 250.00  100 each  11  . niacin (NIASPAN) 500 MG CR tablet TAKE ONE TABLET BY MOUTH NIGHTLY AT BEDTIME  30 tablet  11  . nitroGLYCERIN (NITROSTAT) 0.4 MG SL tablet Place 1 tablet (0.4 mg total) under the tongue every 5 (five) minutes as needed.  25 tablet  11  . ONE TOUCH ULTRA TEST test strip TEST ONCE OR TWICE DAILY  200 each  9  . rosuvastatin (CRESTOR) 20 MG tablet Take 20 mg by mouth daily. Take 1/2 daily      . warfarin (COUMADIN) 5 MG tablet Take 1 tablet (5 mg total) by mouth as directed.  120 tablet  1  . Zn-Pyg Afri-Nettle-Saw Palmet (SAW PALMETTO COMPLEX) CAPS Take 1 capsule by mouth daily.          Allergies  Allergen Reactions  . Cefuroxime Axetil     REACTION: unspecified  . Heparin     REACTION: rash    Past Medical History  Diagnosis Date  . CAD (coronary artery disease) 1974    Dr. Riley Kill   . DM2 (diabetes mellitus, type 2) 2003  . HTN (hypertension)   . BPH (benign prostatic hypertrophy)   . Thyroid cyst     incidental finding on ultra sound  . Atrial fibrillation   . Hyperlipidemia   . SSS (sick sinus syndrome)     Past Surgical History  Procedure Date  . Coronary  artery bypass graft 1994  . Permanent pacemaker 04/09/06    dual chamber permanent pacemaker  . Tonsillectomy     childhood  . Pilonidal cystectomy     Family History  Problem Relation Age of Onset  . Colon cancer Neg Hx   . Prostate cancer Neg Hx     History   Social History  . Marital Status: Married    Spouse Name: N/A    Number of Children: N/A  . Years of Education: N/A   Occupational History  . retired Airline pilot for [employer]    Social History Main Topics  . Smoking status: Former Games developer  . Smokeless tobacco: Never Used  . Alcohol Use: No  . Drug Use: No  . Sexually Active: Not on file   Other Topics Concern  . Not on file   Social History Narrative    Has a living will- wife or attorney [name] to have health care POA. Would accept resuscitation but no prolonged machines, not sure about feeding  tube.    ROS: Please see the HPI.  All other systems reviewed and negative.  PHYSICAL EXAM:  BP 120/67  Pulse 61  Ht 5\' 6"  (1.676 m)  Wt 152 lb (68.947 kg)  BMI 24.53 kg/m2  SpO2 96%  General: elderly thin male in no distress Head:  Normocephalic and atraumatic. Neck: no JVD.  Pacer site looks ok.   Lungs: Clear to auscultation and percussion. Heart: Normal S1 and S2.  No murmur, rubs or gallops.   MS healed.   Abdomen:  Normal bowel sounds; soft; non tender; no organomegaly Pulses: Pulses normal in all 4 extremities. Extremities: No clubbing or cyanosis. No edema. Neurologic: Alert and oriented x 3.  EKG:  Atrial pacing.  Nonspecific T flattening.   ASSESSMENT AND PLAN:

## 2012-04-19 NOTE — Assessment & Plan Note (Addendum)
Will check lipid and liver profile.  This will need to be done at [facility].  Patient to have follow up with Dr. Mariah Milling in [place].  We might consider stopping his Niacin based on multiple trials.

## 2012-04-19 NOTE — Assessment & Plan Note (Signed)
Now nearly twenty years post CABG and remains stable.

## 2012-04-20 ENCOUNTER — Other Ambulatory Visit: Payer: Self-pay | Admitting: Internal Medicine

## 2012-04-25 ENCOUNTER — Other Ambulatory Visit: Payer: Medicare Other

## 2012-04-26 ENCOUNTER — Telehealth: Payer: Self-pay | Admitting: *Deleted

## 2012-04-26 ENCOUNTER — Ambulatory Visit (INDEPENDENT_AMBULATORY_CARE_PROVIDER_SITE_OTHER): Payer: Medicare Other

## 2012-04-26 DIAGNOSIS — E785 Hyperlipidemia, unspecified: Secondary | ICD-10-CM

## 2012-04-26 NOTE — Telephone Encounter (Signed)
Called to speak with pt's wife and pt had a question about if he could go to [place] to have his PT/INR checked? Pt states he could go to [facility] but they have to draw blood and he likes getting the results instantly like with the machine that we use here. Please advise

## 2012-04-26 NOTE — Telephone Encounter (Signed)
It would have to be approved by his cardiologist  We cannot have them do it since it isn't our office He should check with Dr Riley Kill (who I think is retiring soon)-----he may want to switch his cardiology care to that office and then they would definitely be able to do it

## 2012-04-27 ENCOUNTER — Other Ambulatory Visit: Payer: Self-pay | Admitting: *Deleted

## 2012-04-27 LAB — HEPATIC FUNCTION PANEL
ALT: 14 IU/L (ref 0–44)
AST: 18 IU/L (ref 0–40)
Albumin: 4.3 g/dL (ref 3.2–4.6)

## 2012-04-27 LAB — LIPID PANEL
Cholesterol, Total: 158 mg/dL (ref 100–199)
LDL Calculated: 85 mg/dL (ref 0–99)
Triglycerides: 69 mg/dL (ref 0–149)
VLDL Cholesterol Cal: 14 mg/dL (ref 5–40)

## 2012-04-27 MED ORDER — GLUCOSE BLOOD VI STRP: ORAL_STRIP | Status: DC

## 2012-04-27 NOTE — Telephone Encounter (Signed)
Spoke with patient and he's changing to [facility] with Dr.Gollan and he will check with them.

## 2012-05-09 ENCOUNTER — Ambulatory Visit (INDEPENDENT_AMBULATORY_CARE_PROVIDER_SITE_OTHER): Payer: Medicare Other | Admitting: General Practice

## 2012-05-09 DIAGNOSIS — I4891 Unspecified atrial fibrillation: Secondary | ICD-10-CM

## 2012-05-09 DIAGNOSIS — I25810 Atherosclerosis of coronary artery bypass graft(s) without angina pectoris: Secondary | ICD-10-CM

## 2012-05-09 LAB — POCT INR: INR: 2.8

## 2012-05-09 NOTE — Patient Instructions (Addendum)
Continue 2.5 mg daily, 5 mg Tue recheck 4 weeks

## 2012-05-17 ENCOUNTER — Ambulatory Visit (INDEPENDENT_AMBULATORY_CARE_PROVIDER_SITE_OTHER): Payer: Medicare Other | Admitting: Internal Medicine

## 2012-05-17 ENCOUNTER — Encounter: Payer: Self-pay | Admitting: Internal Medicine

## 2012-05-17 VITALS — BP 104/60 | HR 65 | Ht 66.0 in | Wt 140.0 lb

## 2012-05-17 DIAGNOSIS — Z950 Presence of cardiac pacemaker: Secondary | ICD-10-CM

## 2012-05-17 DIAGNOSIS — I4891 Unspecified atrial fibrillation: Secondary | ICD-10-CM

## 2012-05-17 DIAGNOSIS — I495 Sick sinus syndrome: Secondary | ICD-10-CM

## 2012-05-17 DIAGNOSIS — R0789 Other chest pain: Secondary | ICD-10-CM

## 2012-05-17 DIAGNOSIS — I10 Essential (primary) hypertension: Secondary | ICD-10-CM

## 2012-05-17 LAB — PACEMAKER DEVICE OBSERVATION
AL AMPLITUDE: 2.2 mv
BAMS-0001: 170 {beats}/min
DEVICE MODEL PM: 318781
RV LEAD AMPLITUDE: 9.1 mv
RV LEAD IMPEDENCE PM: 490 Ohm
VENTRICULAR PACING PM: 18

## 2012-05-17 NOTE — Assessment & Plan Note (Signed)
He is maintaining sinus rhythm. He has had no mode switches since his last pacemaker interrogation several months ago.

## 2012-05-17 NOTE — Assessment & Plan Note (Signed)
His blood pressure is well controlled. He will continue his current medical therapy, and maintain a low-sodium diet.

## 2012-05-17 NOTE — Assessment & Plan Note (Signed)
His [employer] dual-chamber pacemaker is working normally. We'll plan to recheck in several months. 

## 2012-05-17 NOTE — Progress Notes (Signed)
HPI [name] returns today for followup. He is a very pleasant 79 year old man with coronary artery disease, symptomatic bradycardia, paroxysmal atrial fibrillation, status post permanent pacemaker insertion. In the interim, he has done well. He denies chest pain or shortness of breath. No peripheral edema. Allergies  Allergen Reactions  . Cefuroxime Axetil     REACTION: unspecified  . Heparin     REACTION: rash     Current Outpatient Prescriptions  Medication Sig Dispense Refill  . amLODipine (NORVASC) 10 MG tablet TAKE ONE TABLET BY MOUTH ONE TIME DAILY  90 tablet  3  . Ascorbic Acid (VITAMIN C) 1000 MG tablet Take 1,000 mg by mouth daily.        Marland Kitchen aspirin 81 MG tablet Take 81 mg by mouth daily.        . B Complex-C-E-Zn (BEC/ZINC) TABS Take 1 tablet by mouth daily.        Marland Kitchen glimepiride (AMARYL) 1 MG tablet Take 1 tablet (1 mg total) by mouth daily before breakfast.  90 tablet  3  . glucose blood (ONE TOUCH ULTRA TEST) test strip Use as instructed to test blood sugar once or twice daily dx: 250.00  200 each  3  . niacin (NIASPAN) 500 MG CR tablet TAKE ONE TABLET BY MOUTH NIGHTLY AT BEDTIME  30 tablet  11  . nitroGLYCERIN (NITROSTAT) 0.4 MG SL tablet Place 1 tablet (0.4 mg total) under the tongue every 5 (five) minutes as needed.  25 tablet  11  . rosuvastatin (CRESTOR) 20 MG tablet Take 20 mg by mouth daily. Take 1/2 daily      . warfarin (COUMADIN) 5 MG tablet Take 1 tablet (5 mg total) by mouth as directed.  120 tablet  1  . Zn-Pyg Afri-Nettle-Saw Palmet (SAW PALMETTO COMPLEX) CAPS Take 1 capsule by mouth daily.         No current facility-administered medications for this visit.     Past Medical History  Diagnosis Date  . CAD (coronary artery disease) 1974    Dr. Riley Kill   . DM2 (diabetes mellitus, type 2) 2003  . HTN (hypertension)   . BPH (benign prostatic hypertrophy)   . Thyroid cyst     incidental finding on ultra sound  . Atrial fibrillation   . Hyperlipidemia   .  SSS (sick sinus syndrome)     ROS:   All systems reviewed and negative except as noted in the HPI.   Past Surgical History  Procedure Laterality Date  . Coronary artery bypass graft  1994  . Permanent pacemaker  04/09/06    dual chamber permanent pacemaker  . Tonsillectomy      childhood  . Pilonidal cystectomy       Family History  Problem Relation Age of Onset  . Colon cancer Neg Hx   . Prostate cancer Neg Hx      History   Social History  . Marital Status: Married    Spouse Name: N/A    Number of Children: N/A  . Years of Education: N/A   Occupational History  . retired Airline pilot for [employer]    Social History Main Topics  . Smoking status: Former Games developer  . Smokeless tobacco: Never Used  . Alcohol Use: No  . Drug Use: No  . Sexually Active: Not on file   Other Topics Concern  . Not on file   Social History Narrative    Has a living will- wife or attorney [name] to have  health care POA. Would accept resuscitation but no prolonged machines, not sure about feeding tube.              BP 104/60  Pulse 65  Ht 5\' 6"  (1.676 m)  Wt 140 lb (63.504 kg)  BMI 22.61 kg/m2  Physical Exam:  Well appearing NAD HEENT: Unremarkable Neck:  No JVD, no thyromegally Lungs:  Clear with no wheezes, rales, or rhonchi. HEART:  Regular rate rhythm, no murmurs, no rubs, no clicks Abd:  soft, positive bowel sounds, no organomegally, no rebound, no guarding Ext:  2 plus pulses, no edema, no cyanosis, no clubbing Skin:  No rashes no nodules Neuro:  CN II through XII intact, motor grossly intact  DEVICE  Normal device function.  See PaceArt for details.   Assess/Plan:

## 2012-05-17 NOTE — Patient Instructions (Addendum)
Your physician wants you to follow-up in: 1 year with Dr. Taylor. You will receive a reminder letter in the mail two months in advance. If you don't receive a letter, please call our office to schedule the follow-up appointment.  

## 2012-05-20 ENCOUNTER — Ambulatory Visit (INDEPENDENT_AMBULATORY_CARE_PROVIDER_SITE_OTHER): Payer: Medicare Other | Admitting: Internal Medicine

## 2012-05-20 ENCOUNTER — Encounter: Payer: Self-pay | Admitting: Internal Medicine

## 2012-05-20 VITALS — BP 128/60 | HR 78 | Temp 98.4°F | Wt 153.0 lb

## 2012-05-20 DIAGNOSIS — I4891 Unspecified atrial fibrillation: Secondary | ICD-10-CM

## 2012-05-20 DIAGNOSIS — E119 Type 2 diabetes mellitus without complications: Secondary | ICD-10-CM

## 2012-05-20 DIAGNOSIS — N400 Enlarged prostate without lower urinary tract symptoms: Secondary | ICD-10-CM

## 2012-05-20 DIAGNOSIS — I10 Essential (primary) hypertension: Secondary | ICD-10-CM

## 2012-05-20 DIAGNOSIS — I2510 Atherosclerotic heart disease of native coronary artery without angina pectoris: Secondary | ICD-10-CM

## 2012-05-20 DIAGNOSIS — Z1331 Encounter for screening for depression: Secondary | ICD-10-CM

## 2012-05-20 LAB — CBC WITH DIFFERENTIAL/PLATELET
Basophils Relative: 0.4 % (ref 0.0–3.0)
Eosinophils Absolute: 0.1 10*3/uL (ref 0.0–0.7)
Eosinophils Relative: 2.5 % (ref 0.0–5.0)
HCT: 40.8 % (ref 39.0–52.0)
Hemoglobin: 13.7 g/dL (ref 13.0–17.0)
Lymphs Abs: 2.1 10*3/uL (ref 0.7–4.0)
MCHC: 33.7 g/dL (ref 30.0–36.0)
MCV: 101.1 fl — ABNORMAL HIGH (ref 78.0–100.0)
Monocytes Absolute: 0.6 10*3/uL (ref 0.1–1.0)
Neutro Abs: 2.6 10*3/uL (ref 1.4–7.7)
Neutrophils Relative %: 47.5 % (ref 43.0–77.0)
RBC: 4.03 Mil/uL — ABNORMAL LOW (ref 4.22–5.81)
WBC: 5.5 10*3/uL (ref 4.5–10.5)

## 2012-05-20 LAB — BASIC METABOLIC PANEL
CO2: 26 mEq/L (ref 19–32)
Chloride: 107 mEq/L (ref 96–112)
Creatinine, Ser: 1.1 mg/dL (ref 0.4–1.5)
Potassium: 4.1 mEq/L (ref 3.5–5.1)

## 2012-05-20 LAB — HEMOGLOBIN A1C: Hgb A1c MFr Bld: 6.5 % (ref 4.6–6.5)

## 2012-05-20 NOTE — Assessment & Plan Note (Signed)
No symptoms Very fit--especially for age No changes needed

## 2012-05-20 NOTE — Assessment & Plan Note (Signed)
Paced rhythm Remains on coumadin

## 2012-05-20 NOTE — Progress Notes (Signed)
Subjective:    Patient ID: [name], male    DOB: 1917-11-17, 79 y.o.   MRN: [id number]  HPI Here with wife Doing well Was concerned about the wording about his HDL report---it was 59. Explained how good that was. Recent cardiology evaluation was fine  Continues to monitor his sugars bid All are good Fasting pretty much always under 110 Rare mild hypoglycemic reactions  No chest pain Notes occ skip in heartr--always at rest Continues to exercise regularly--feels his legs are somewhat less strong Uses walker at night for nocturia for safety  Gets some urgency during the day occ has some dribbling before he can make it to bathroom  Current Outpatient Prescriptions on File Prior to Visit  Medication Sig Dispense Refill  . amLODipine (NORVASC) 10 MG tablet TAKE ONE TABLET BY MOUTH ONE TIME DAILY  90 tablet  3  . Ascorbic Acid (VITAMIN C) 1000 MG tablet Take 1,000 mg by mouth daily.        Marland Kitchen aspirin 81 MG tablet Take 81 mg by mouth daily.        . B Complex-C-E-Zn (BEC/ZINC) TABS Take 1 tablet by mouth daily.        Marland Kitchen glimepiride (AMARYL) 1 MG tablet Take 1 tablet (1 mg total) by mouth daily before breakfast.  90 tablet  3  . glucose blood (ONE TOUCH ULTRA TEST) test strip Use as instructed to test blood sugar once or twice daily dx: 250.00  200 each  3  . niacin (NIASPAN) 500 MG CR tablet TAKE ONE TABLET BY MOUTH NIGHTLY AT BEDTIME  30 tablet  11  . nitroGLYCERIN (NITROSTAT) 0.4 MG SL tablet Place 1 tablet (0.4 mg total) under the tongue every 5 (five) minutes as needed.  25 tablet  11  . rosuvastatin (CRESTOR) 20 MG tablet Take 20 mg by mouth daily. Take 1/2 daily      . warfarin (COUMADIN) 5 MG tablet Take 1 tablet (5 mg total) by mouth as directed.  120 tablet  1  . Zn-Pyg Afri-Nettle-Saw Palmet (SAW PALMETTO COMPLEX) CAPS Take 1 capsule by mouth daily.         No current facility-administered medications on file prior to visit.    Allergies  Allergen Reactions  .  Cefuroxime Axetil     REACTION: unspecified  . Heparin     REACTION: rash    Past Medical History  Diagnosis Date  . CAD (coronary artery disease) 1974    Dr. Riley Kill   . DM2 (diabetes mellitus, type 2) 2003  . HTN (hypertension)   . BPH (benign prostatic hypertrophy)   . Thyroid cyst     incidental finding on ultra sound  . Atrial fibrillation   . Hyperlipidemia   . SSS (sick sinus syndrome)     Past Surgical History  Procedure Laterality Date  . Coronary artery bypass graft  1994  . Permanent pacemaker  04/09/06    dual chamber permanent pacemaker  . Tonsillectomy      childhood  . Pilonidal cystectomy      Family History  Problem Relation Age of Onset  . Colon cancer Neg Hx   . Prostate cancer Neg Hx     History   Social History  . Marital Status: Married    Spouse Name: N/A    Number of Children: N/A  . Years of Education: N/A   Occupational History  . retired Airline pilot for [employer]    Social History Main Topics  .  Smoking status: Former Games developer  . Smokeless tobacco: Never Used  . Alcohol Use: No  . Drug Use: No  . Sexually Active: Not on file   Other Topics Concern  . Not on file   Social History Narrative    Has a living will- wife or attorney [name] to have health care POA. Would accept resuscitation but no prolonged machines, not sure about feeding tube.            Review of Systems Sleeping fine Appetite is fine Weight stable Has back pain first thing in the AM---uses horse linament and alcohol---as well as tylenol Known leg length discrepancy---still wears lift in left shoe    Objective:   Physical Exam  Constitutional: He appears well-developed and well-nourished. No distress.  Neck: Normal range of motion. Neck supple. No thyromegaly present.  Cardiovascular: Normal rate, regular rhythm, normal heart sounds and intact distal pulses.  Exam reveals no gallop.   No murmur heard. Pulmonary/Chest: Effort normal and breath  sounds normal. No respiratory distress. He has no wheezes. He has no rales.  Abdominal: Soft. There is no tenderness.  Musculoskeletal: He exhibits no edema and no tenderness.  Lymphadenopathy:    He has no cervical adenopathy.  Skin: No rash noted.  No foot lesions  Psychiatric: He has a normal mood and affect. His behavior is normal.          Assessment & Plan:

## 2012-05-20 NOTE — Assessment & Plan Note (Signed)
Mild voiding problems but no major issues

## 2012-05-20 NOTE — Assessment & Plan Note (Signed)
Lab Results  Component Value Date   HGBA1C 6.2 02/12/2012   Meticulous with control Will check A1c again at his request

## 2012-05-20 NOTE — Assessment & Plan Note (Signed)
BP Readings from Last 3 Encounters:  05/20/12 128/60  05/17/12 104/60  04/19/12 120/67   Good control

## 2012-05-24 ENCOUNTER — Encounter: Payer: Self-pay | Admitting: *Deleted

## 2012-06-06 ENCOUNTER — Other Ambulatory Visit (INDEPENDENT_AMBULATORY_CARE_PROVIDER_SITE_OTHER): Payer: Medicare Other

## 2012-06-06 ENCOUNTER — Ambulatory Visit (INDEPENDENT_AMBULATORY_CARE_PROVIDER_SITE_OTHER): Payer: Medicare Other | Admitting: General Practice

## 2012-06-06 DIAGNOSIS — I25810 Atherosclerosis of coronary artery bypass graft(s) without angina pectoris: Secondary | ICD-10-CM

## 2012-06-06 DIAGNOSIS — I4891 Unspecified atrial fibrillation: Secondary | ICD-10-CM

## 2012-06-06 DIAGNOSIS — E041 Nontoxic single thyroid nodule: Secondary | ICD-10-CM

## 2012-06-06 NOTE — Patient Instructions (Signed)
Continue 2.5 mg daily, 5 mg Tue recheck 4 weeks  

## 2012-06-16 ENCOUNTER — Encounter: Payer: Self-pay | Admitting: *Deleted

## 2012-07-04 ENCOUNTER — Ambulatory Visit (INDEPENDENT_AMBULATORY_CARE_PROVIDER_SITE_OTHER): Payer: Medicare Other | Admitting: General Practice

## 2012-07-04 DIAGNOSIS — I25810 Atherosclerosis of coronary artery bypass graft(s) without angina pectoris: Secondary | ICD-10-CM

## 2012-07-04 DIAGNOSIS — Z7901 Long term (current) use of anticoagulants: Secondary | ICD-10-CM

## 2012-07-04 DIAGNOSIS — Z5181 Encounter for therapeutic drug level monitoring: Secondary | ICD-10-CM

## 2012-07-04 DIAGNOSIS — I4891 Unspecified atrial fibrillation: Secondary | ICD-10-CM

## 2012-07-04 NOTE — Patient Instructions (Addendum)
Continue 2.5 mg daily, 5 mg Tue recheck 6 weeks

## 2012-07-18 ENCOUNTER — Encounter: Payer: Self-pay | Admitting: Cardiovascular Disease

## 2012-07-18 ENCOUNTER — Ambulatory Visit (INDEPENDENT_AMBULATORY_CARE_PROVIDER_SITE_OTHER): Payer: Medicare Other | Admitting: Cardiovascular Disease

## 2012-07-18 VITALS — BP 98/60 | HR 60 | Ht 66.0 in | Wt 152.0 lb

## 2012-07-18 DIAGNOSIS — I4891 Unspecified atrial fibrillation: Secondary | ICD-10-CM

## 2012-07-18 DIAGNOSIS — I25810 Atherosclerosis of coronary artery bypass graft(s) without angina pectoris: Secondary | ICD-10-CM

## 2012-07-18 DIAGNOSIS — I495 Sick sinus syndrome: Secondary | ICD-10-CM

## 2012-07-18 DIAGNOSIS — E119 Type 2 diabetes mellitus without complications: Secondary | ICD-10-CM

## 2012-07-18 DIAGNOSIS — I10 Essential (primary) hypertension: Secondary | ICD-10-CM

## 2012-07-18 DIAGNOSIS — E785 Hyperlipidemia, unspecified: Secondary | ICD-10-CM

## 2012-07-18 MED ORDER — NITROGLYCERIN 0.4 MG SL SUBL: 0.4000 mg | SUBLINGUAL_TABLET | SUBLINGUAL | Status: DC | PRN

## 2012-07-18 NOTE — Assessment & Plan Note (Signed)
Cholesterol is at goal on the current lipid regimen. No changes to the medications were made.  

## 2012-07-18 NOTE — Assessment & Plan Note (Signed)
Currently with no symptoms of angina. No further workup at this time. Continue current medication regimen. 

## 2012-07-18 NOTE — Assessment & Plan Note (Signed)
Has a pacemaker. Followed by Dr. [name]. No complaints

## 2012-07-18 NOTE — Assessment & Plan Note (Signed)
Blood pressure is low today. Rare lightheaded episodes. We have suggested he cut his amlodipine in half. If blood pressure climbs, perhaps he could take amlodipine 5 mg twice a day rather than 10 mg in the morning

## 2012-07-18 NOTE — Assessment & Plan Note (Signed)
We have encouraged continued exercise, careful diet management in an effort to lose weight. 

## 2012-07-18 NOTE — Progress Notes (Signed)
Patient ID: [name], male    DOB: [birth year], 79 y.o.   MRN: [id number]  HPI Comments: [name] is a very pleasant 79 year old gentleman with a history of bypass surgery in 1994, sick sinus syndrome with pacemaker placement in 2008, diabetes, hypertension, hyperlipidemia presents to establish care in the [place] office.  Reports that he is doing well overall. He is active, no recent chest pain or shortness of breath. No recent falls. No lower extremity edema. Recent evaluation of his pacemaker was okay. Recently seen by Dr. Alphonsus Sias. Overall he and his wife have no complaints. He does report occasional lightheaded episodes.  EKG shows normal sinus rhythm with rate 60 beats per minute with no significant ST or T wave changes   Outpatient Encounter Prescriptions as of 07/18/2012  Medication Sig Dispense Refill  . amLODipine (NORVASC) 10 MG tablet TAKE ONE TABLET BY MOUTH ONE TIME DAILY  90 tablet  3  . Ascorbic Acid (VITAMIN C) 1000 MG tablet Take 1,000 mg by mouth daily.        Marland Kitchen aspirin 81 MG tablet Take 81 mg by mouth daily.        . B Complex-C-E-Zn (BEC/ZINC) TABS Take 1 tablet by mouth daily.        Marland Kitchen glimepiride (AMARYL) 1 MG tablet Take 1 tablet (1 mg total) by mouth daily before breakfast.  90 tablet  3  . glucose blood (ONE TOUCH ULTRA TEST) test strip Use as instructed to test blood sugar once or twice daily dx: 250.00  200 each  3  . niacin (NIASPAN) 500 MG CR tablet TAKE ONE TABLET BY MOUTH NIGHTLY AT BEDTIME  30 tablet  11  . nitroGLYCERIN (NITROSTAT) 0.4 MG SL tablet Place 1 tablet (0.4 mg total) under the tongue every 5 (five) minutes as needed.  25 tablet  11  . rosuvastatin (CRESTOR) 20 MG tablet Take 20 mg by mouth daily. Take 1/2 daily      . warfarin (COUMADIN) 5 MG tablet Take 1 tablet (5 mg total) by mouth as directed.  120 tablet  1  . Zn-Pyg Afri-Nettle-Saw Palmet (SAW PALMETTO COMPLEX) CAPS Take 1 capsule by mouth daily.        . [DISCONTINUED] nitroGLYCERIN  (NITROSTAT) 0.4 MG SL tablet Place 1 tablet (0.4 mg total) under the tongue every 5 (five) minutes as needed.  25 tablet  11   No facility-administered encounter medications on file as of 07/18/2012.     Review of Systems  Constitutional: Negative.   HENT: Negative.   Eyes: Negative.   Respiratory: Negative.   Cardiovascular: Negative.   Gastrointestinal: Negative.   Musculoskeletal: Negative.   Skin: Negative.   Neurological: Positive for light-headedness.  Psychiatric/Behavioral: Negative.   All other systems reviewed and are negative.    BP 98/60  Pulse 60  Ht 5\' 6"  (1.676 m)  Wt 152 lb (68.947 kg)  BMI 24.55 kg/m2 Low blood pressure was confirmed on recheck bilaterally Physical Exam  Nursing note and vitals reviewed. Constitutional: He is oriented to person, place, and time. He appears well-developed and well-nourished.  HENT:  Head: Normocephalic.  Nose: Nose normal.  Mouth/Throat: Oropharynx is clear and moist.  Eyes: Conjunctivae are normal. Pupils are equal, round, and reactive to light.  Neck: Normal range of motion. Neck supple. No JVD present.  Cardiovascular: Normal rate, regular rhythm, S1 normal, S2 normal, normal heart sounds and intact distal pulses.  Exam reveals no gallop and no friction rub.  No murmur heard. Pulmonary/Chest: Effort normal and breath sounds normal. No respiratory distress. He has no wheezes. He has no rales. He exhibits no tenderness.  Abdominal: Soft. Bowel sounds are normal. He exhibits no distension. There is no tenderness.  Musculoskeletal: Normal range of motion. He exhibits no edema and no tenderness.  Lymphadenopathy:    He has no cervical adenopathy.  Neurological: He is alert and oriented to person, place, and time. Coordination normal.  Skin: Skin is warm and dry. No rash noted. No erythema.  Psychiatric: He has a normal mood and affect. His behavior is normal. Judgment and thought content normal.      Assessment and  Plan

## 2012-07-18 NOTE — Patient Instructions (Addendum)
You are doing well. Please cut the amlodipine in 1/2 daily If your blood pressure runs high, take amlodipine 1/2 twice a day  Please call us if you have new issues that need to be addressed before your next appt.  Your physician wants you to follow-up in: 6 months.  You will receive a reminder letter in the mail two months in advance. If you don't receive a letter, please call our office to schedule the follow-up appointment.

## 2012-07-20 ENCOUNTER — Ambulatory Visit: Payer: Medicare Other | Admitting: Cardiovascular Disease

## 2012-07-27 ENCOUNTER — Telehealth: Payer: Self-pay

## 2012-07-27 MED ORDER — GLUCOSE BLOOD VI STRP: ORAL_STRIP | Status: DC

## 2012-07-27 NOTE — Telephone Encounter (Signed)
[name] with [facility] called back; in Jan CMN form was filled out for once a day testing;[facility] needs new rx with instructions kc BS twice a day; cannot be once or twice daily due to Medicare guidelines. After receive new [facility] office will fax new CMN form to be completed for twice daily testing. New rx for diabetic test strip sent to [facility] [address].

## 2012-07-27 NOTE — Telephone Encounter (Signed)
Will fill out when it comes Please remind him that once a day is enough--he can vary the time and skip some days to cover the times he wants to check extra

## 2012-07-27 NOTE — Telephone Encounter (Addendum)
On 07/25/12 pt got # 100 test strips at [facility]. [facility] [address] said a CMN form needs to be filled out for pt; this form is required for diabetic supplies by medicare. Pt wants to be able to ck BS twice a day. [facility] will fax form. Pt request call back when form completed.

## 2012-07-28 NOTE — Telephone Encounter (Signed)
Form on your desk  

## 2012-07-28 NOTE — Telephone Encounter (Signed)
I did do it for bid since he gets so anxious and needs to know

## 2012-08-12 NOTE — Telephone Encounter (Signed)
[name] with [facility] on [address] has had a new CMN form faxed to our office; medicare did not receive previous CMN. Can fax form to [name] at [phone] and she will make sure gets to appropriate place. [name] said OK to fax copy of CMN previously signed by Dr Alphonsus Sias. CMN faxed to [facility].

## 2012-08-15 ENCOUNTER — Ambulatory Visit (INDEPENDENT_AMBULATORY_CARE_PROVIDER_SITE_OTHER): Payer: Medicare Other | Admitting: General Practice

## 2012-08-15 DIAGNOSIS — I4891 Unspecified atrial fibrillation: Secondary | ICD-10-CM

## 2012-08-15 DIAGNOSIS — Z5181 Encounter for therapeutic drug level monitoring: Secondary | ICD-10-CM

## 2012-08-15 DIAGNOSIS — Z7901 Long term (current) use of anticoagulants: Secondary | ICD-10-CM

## 2012-08-15 DIAGNOSIS — I25810 Atherosclerosis of coronary artery bypass graft(s) without angina pectoris: Secondary | ICD-10-CM

## 2012-08-15 LAB — POCT INR: INR: 2.4

## 2012-08-15 NOTE — Telephone Encounter (Signed)
[name] with [facility] in [place] left v/m medicare will only pay for testing once daily and pt is out of test strips due to pt has been testing twice a day. [name] said Dr Karle Starch assistance called earlier today about status of test strips. [name] is not sure how to proceed.

## 2012-08-15 NOTE — Patient Instructions (Signed)
Continue 2.5 mg daily, 5 mg Tue recheck 6 weeks  

## 2012-08-15 NOTE — Telephone Encounter (Signed)
Let him know that I tried to word it on the form to approve twice a day for him but they won't Can send new Rx---he may have to pay  Explain that he can check twice a day---if he only does it every other day. Or just switch to once a day (he can vary the times if he wants)

## 2012-08-15 NOTE — Telephone Encounter (Signed)
Please advise if patient needs to pay out of pocket for test strips? I advised pt that he can only test once daily because he's not on insulin.

## 2012-08-16 NOTE — Telephone Encounter (Signed)
[name] at [facility] called to make sure everyone on same pg; advised [name] last phone note from Dr Alphonsus Sias and spoke with pt today.

## 2012-08-16 NOTE — Telephone Encounter (Signed)
Pt does not need new rx; pt has refills at [facility]. Patient notified as instructed by telephone.

## 2012-08-29 ENCOUNTER — Other Ambulatory Visit: Payer: Self-pay | Admitting: Internal Medicine

## 2012-09-26 ENCOUNTER — Ambulatory Visit: Payer: Medicare Other

## 2012-10-03 ENCOUNTER — Ambulatory Visit (INDEPENDENT_AMBULATORY_CARE_PROVIDER_SITE_OTHER): Payer: Medicare Other | Admitting: Family Medicine

## 2012-10-03 ENCOUNTER — Other Ambulatory Visit (INDEPENDENT_AMBULATORY_CARE_PROVIDER_SITE_OTHER): Payer: Medicare Other

## 2012-10-03 ENCOUNTER — Telehealth: Payer: Self-pay

## 2012-10-03 DIAGNOSIS — E1059 Type 1 diabetes mellitus with other circulatory complications: Secondary | ICD-10-CM

## 2012-10-03 DIAGNOSIS — I4891 Unspecified atrial fibrillation: Secondary | ICD-10-CM

## 2012-10-03 DIAGNOSIS — Z5181 Encounter for therapeutic drug level monitoring: Secondary | ICD-10-CM

## 2012-10-03 DIAGNOSIS — Z7901 Long term (current) use of anticoagulants: Secondary | ICD-10-CM

## 2012-10-03 DIAGNOSIS — I25810 Atherosclerosis of coronary artery bypass graft(s) without angina pectoris: Secondary | ICD-10-CM

## 2012-10-03 NOTE — Patient Instructions (Signed)
Continue 2.5 mg daily, 5 mg Tuesdays. Recheck in 6 weeks.

## 2012-10-03 NOTE — Telephone Encounter (Signed)
Okay to add A1c today

## 2012-10-03 NOTE — Telephone Encounter (Signed)
Pt sent to the lab

## 2012-10-03 NOTE — Telephone Encounter (Signed)
Pt has coumadin lab appt today at 10:15 and request A1C test done while in office. Last A1C 05/24/12.Please advise.

## 2012-10-04 ENCOUNTER — Encounter: Payer: Self-pay | Admitting: Family Medicine

## 2012-11-14 ENCOUNTER — Ambulatory Visit (INDEPENDENT_AMBULATORY_CARE_PROVIDER_SITE_OTHER): Payer: Medicare Other | Admitting: *Deleted

## 2012-11-14 ENCOUNTER — Other Ambulatory Visit: Payer: Self-pay | Admitting: Internal Medicine

## 2012-11-14 ENCOUNTER — Ambulatory Visit (INDEPENDENT_AMBULATORY_CARE_PROVIDER_SITE_OTHER): Payer: Medicare Other | Admitting: Family Medicine

## 2012-11-14 DIAGNOSIS — I25810 Atherosclerosis of coronary artery bypass graft(s) without angina pectoris: Secondary | ICD-10-CM

## 2012-11-14 DIAGNOSIS — I4891 Unspecified atrial fibrillation: Secondary | ICD-10-CM

## 2012-11-14 DIAGNOSIS — Z5181 Encounter for therapeutic drug level monitoring: Secondary | ICD-10-CM

## 2012-11-14 DIAGNOSIS — I495 Sick sinus syndrome: Secondary | ICD-10-CM

## 2012-11-14 DIAGNOSIS — Z7901 Long term (current) use of anticoagulants: Secondary | ICD-10-CM

## 2012-11-14 LAB — PACEMAKER DEVICE OBSERVATION
AL AMPLITUDE: 2 mv
ATRIAL PACING PM: 71
BAMS-0001: 170 {beats}/min
BAMS-0002: 8 ms
BAMS-0003: 70 {beats}/min
DEVICE MODEL PM: 318781
RV LEAD THRESHOLD: 1 V

## 2012-11-14 NOTE — Progress Notes (Signed)
PPM check in office. 

## 2012-11-14 NOTE — Patient Instructions (Signed)
Skip Coumadin today (8/18) and then continue 2.5 mg daily, 5 mg Tuesdays. Recheck in 6 weeks per pt request.

## 2012-11-18 ENCOUNTER — Ambulatory Visit (INDEPENDENT_AMBULATORY_CARE_PROVIDER_SITE_OTHER): Payer: Medicare Other | Admitting: Internal Medicine

## 2012-11-18 ENCOUNTER — Encounter: Payer: Self-pay | Admitting: Internal Medicine

## 2012-11-18 VITALS — BP 128/60 | HR 69 | Ht 66.0 in | Wt 149.0 lb

## 2012-11-18 DIAGNOSIS — I10 Essential (primary) hypertension: Secondary | ICD-10-CM

## 2012-11-18 DIAGNOSIS — E785 Hyperlipidemia, unspecified: Secondary | ICD-10-CM

## 2012-11-18 DIAGNOSIS — N400 Enlarged prostate without lower urinary tract symptoms: Secondary | ICD-10-CM

## 2012-11-18 DIAGNOSIS — E119 Type 2 diabetes mellitus without complications: Secondary | ICD-10-CM

## 2012-11-18 DIAGNOSIS — I4891 Unspecified atrial fibrillation: Secondary | ICD-10-CM

## 2012-11-18 NOTE — Assessment & Plan Note (Signed)
Discussed recent guidelines and his good control Will stop the niacin

## 2012-11-18 NOTE — Assessment & Plan Note (Signed)
Still with excellent control He is aggressive about monitoring and lifestyle issues

## 2012-11-18 NOTE — Progress Notes (Signed)
Subjective:    Patient ID: [name], male    DOB: 11/04/1917, 79 y.o.   MRN: [id number]  HPI Here with wife  Has growth over medial right clavicle Goes back years Has been getting larger No pain, discharge, etc  Still checking sugars bid Fasting sugars mostly under 120 Bedtime 99-258. Almost all under 200 No serious hypoglycemic Still exercises regularly-- calisthenics and walks 2 miles daily  No palpitations No chest pain No SOB--stable exercise status No edema  Chronic back ache Tylenol and rubbing compound help Does improve when sitting but occasionally aches at night Still uses walker to go bathroom at night  Still with nocturia x 2 Mildly slow stream and dribbling No major daytime symptoms (like urgency)  Current Outpatient Prescriptions on File Prior to Visit  Medication Sig Dispense Refill  . amLODipine (NORVASC) 10 MG tablet TAKE ONE TABLET BY MOUTH ONE TIME DAILY  90 tablet  3  . Ascorbic Acid (VITAMIN C) 1000 MG tablet Take 1,000 mg by mouth daily.        Marland Kitchen aspirin 81 MG tablet Take 81 mg by mouth daily.        . B Complex-C-E-Zn (BEC/ZINC) TABS Take 1 tablet by mouth daily.        Marland Kitchen glimepiride (AMARYL) 1 MG tablet Take one tablet by mouth every morning before breakfast  90 tablet  2  . glucose blood (ONE TOUCH ULTRA TEST) test strip Use as instructed to test blood sugar twice daily dx: 250.00  200 each  3  . niacin (NIASPAN) 500 MG CR tablet TAKE ONE TABLET BY MOUTH NIGHTLY AT BEDTIME  30 tablet  11  . nitroGLYCERIN (NITROSTAT) 0.4 MG SL tablet Place 1 tablet (0.4 mg total) under the tongue every 5 (five) minutes as needed.  25 tablet  11  . rosuvastatin (CRESTOR) 20 MG tablet Take 20 mg by mouth daily. Take 1/2 daily      . warfarin (COUMADIN) 5 MG tablet Take 1 tablet (5 mg total) by mouth daily. Take as directed by coumadin clinic.  90 tablet  0  . Zn-Pyg Afri-Nettle-Saw Palmet (SAW PALMETTO COMPLEX) CAPS Take 1 capsule by mouth daily.         No  current facility-administered medications on file prior to visit.    Allergies  Allergen Reactions  . Cefuroxime Axetil     REACTION: unspecified  . Heparin     REACTION: rash    Past Medical History  Diagnosis Date  . CAD (coronary artery disease) 1974    Dr. Riley Kill   . DM2 (diabetes mellitus, type 2) 2003  . HTN (hypertension)   . BPH (benign prostatic hypertrophy)   . Thyroid cyst     incidental finding on ultra sound  . Atrial fibrillation   . Hyperlipidemia   . SSS (sick sinus syndrome)     Past Surgical History  Procedure Laterality Date  . Coronary artery bypass graft  1994  . Permanent pacemaker  04/09/06    dual chamber permanent pacemaker  . Tonsillectomy      childhood  . Pilonidal cystectomy      Family History  Problem Relation Age of Onset  . Colon cancer Neg Hx   . Prostate cancer Neg Hx     History   Social History  . Marital Status: Married    Spouse Name: N/A    Number of Children: N/A  . Years of Education: N/A   Occupational History  .  retired Airline pilot for [employer]    Social History Main Topics  . Smoking status: Former Games developer  . Smokeless tobacco: Never Used  . Alcohol Use: No  . Drug Use: No  . Sexual Activity: Not on file   Other Topics Concern  . Not on file   Social History Narrative    Has a living will- wife or attorney [name] to have health care POA. Would accept resuscitation but no prolonged machines, not sure about feeding tube.             Review of Systems Appetite is okay Trying to watch weight---has lost 3# Generally sleeps okay    Objective:   Physical Exam  Constitutional: He appears well-developed and well-nourished. No distress.  Neck: Normal range of motion. Neck supple. No thyromegaly present.  Cardiovascular: Normal rate, regular rhythm, normal heart sounds and intact distal pulses.  Exam reveals no gallop.   No murmur heard. Pulmonary/Chest: Effort normal and breath sounds normal. No  respiratory distress. He has no wheezes. He has no rales.  Abdominal: Soft. There is no tenderness.  Musculoskeletal: He exhibits no edema and no tenderness.  Lymphadenopathy:    He has no cervical adenopathy.  Skin:  No foot lesions  Small intracutaneous mass in upper right chest--no signs of malignancy  Psychiatric: He has a normal mood and affect. His behavior is normal.          Assessment & Plan:

## 2012-11-18 NOTE — Assessment & Plan Note (Signed)
Mild symptoms which have not progressed

## 2012-11-18 NOTE — Patient Instructions (Signed)
Please stop your niacin when it runs out.

## 2012-11-18 NOTE — Assessment & Plan Note (Signed)
BP Readings from Last 3 Encounters:  11/18/12 128/60  07/18/12 98/60  05/20/12 128/60   Good control  No changes needed

## 2012-11-18 NOTE — Assessment & Plan Note (Signed)
Paced No symptoms On coumadin

## 2012-12-01 ENCOUNTER — Encounter: Payer: Self-pay | Admitting: Internal Medicine

## 2012-12-05 ENCOUNTER — Ambulatory Visit (INDEPENDENT_AMBULATORY_CARE_PROVIDER_SITE_OTHER): Payer: Medicare Other | Admitting: Cardiovascular Disease

## 2012-12-05 ENCOUNTER — Encounter: Payer: Self-pay | Admitting: Cardiovascular Disease

## 2012-12-05 VITALS — BP 118/62 | HR 60 | Ht 66.0 in | Wt 149.8 lb

## 2012-12-05 DIAGNOSIS — R079 Chest pain, unspecified: Secondary | ICD-10-CM

## 2012-12-05 DIAGNOSIS — E119 Type 2 diabetes mellitus without complications: Secondary | ICD-10-CM

## 2012-12-05 DIAGNOSIS — I25810 Atherosclerosis of coronary artery bypass graft(s) without angina pectoris: Secondary | ICD-10-CM

## 2012-12-05 DIAGNOSIS — E785 Hyperlipidemia, unspecified: Secondary | ICD-10-CM

## 2012-12-05 DIAGNOSIS — Z950 Presence of cardiac pacemaker: Secondary | ICD-10-CM

## 2012-12-05 DIAGNOSIS — I10 Essential (primary) hypertension: Secondary | ICD-10-CM

## 2012-12-05 NOTE — Patient Instructions (Addendum)
You are doing well. No medication changes were made.  If you have more chest pain episodes, call the office  Please call us if you have new issues that need to be addressed before your next appt.  Your physician wants you to follow-up in: 6 months.  You will receive a reminder letter in the mail two months in advance. If you don't receive a letter, please call our office to schedule the follow-up appointment.

## 2012-12-05 NOTE — Assessment & Plan Note (Signed)
Followed by Dr. Taylor 

## 2012-12-05 NOTE — Progress Notes (Signed)
Patient ID: [name], male    DOB: 07/12/1917, 79 y.o.   MRN: [id number]  HPI Comments: [name] is a very pleasant 79 year old gentleman with a history of bypass surgery in 1994, sick sinus syndrome with pacemaker placement in 2008, diabetes, hypertension, hyperlipidemia presents for routine followup  He does report having an episode of chest pain one week ago. He woke up in the morning, ate breakfast, then developed chest tightness. He took nitroglycerin x3. He has not had any further episodes since that time. He has continued walking 2 miles per day, even on the day with chest pain. No reproducible chest pain with exertion. Now back to his usual state of health. He's only had 2 episodes of chest pain in the past year, first episode did not need nitroglycerin. Denies having any shortness of breath with exertion   No recent falls. No lower extremity edema.  EKG shows normal sinus rhythm with rate 60 beats per minute with no significant ST or T wave changes   Outpatient Encounter Prescriptions as of 12/05/2012  Medication Sig Dispense Refill  . amLODipine (NORVASC) 10 MG tablet TAKE ONE TABLET BY MOUTH ONE TIME DAILY  90 tablet  3  . Ascorbic Acid (VITAMIN C) 1000 MG tablet Take 1,000 mg by mouth daily.        Marland Kitchen aspirin 81 MG tablet Take 81 mg by mouth daily.        . B Complex-C-E-Zn (BEC/ZINC) TABS Take 1 tablet by mouth daily.        Marland Kitchen glimepiride (AMARYL) 1 MG tablet Take one tablet by mouth every morning before breakfast  90 tablet  2  . glucose blood (ONE TOUCH ULTRA TEST) test strip Use as instructed to test blood sugar twice daily dx: 250.00  200 each  3  . niacin (NIASPAN) 500 MG CR tablet Take 500 mg by mouth at bedtime.       . nitroGLYCERIN (NITROSTAT) 0.4 MG SL tablet Place 1 tablet (0.4 mg total) under the tongue every 5 (five) minutes as needed.  25 tablet  11  . rosuvastatin (CRESTOR) 20 MG tablet Take 20 mg by mouth daily. Take 1/2 daily      . warfarin (COUMADIN) 5 MG  tablet Take 1 tablet (5 mg total) by mouth daily. Take as directed by coumadin clinic.  90 tablet  0  . Zn-Pyg Afri-Nettle-Saw Palmet (SAW PALMETTO COMPLEX) CAPS Take 1 capsule by mouth daily.         No facility-administered encounter medications on file as of 12/05/2012.     Review of Systems  Constitutional: Negative.   HENT: Negative.   Eyes: Negative.   Respiratory: Negative.   Cardiovascular: Positive for chest pain.  Gastrointestinal: Negative.   Musculoskeletal: Negative.   Skin: Negative.   Psychiatric/Behavioral: Negative.   All other systems reviewed and are negative.    BP 118/62  Pulse 60  Ht 5\' 6"  (1.676 m)  Wt 149 lb 12 oz (67.926 kg)  BMI 24.18 kg/m2  Physical Exam  Nursing note and vitals reviewed. Constitutional: He is oriented to person, place, and time. He appears well-developed and well-nourished.  HENT:  Head: Normocephalic.  Nose: Nose normal.  Mouth/Throat: Oropharynx is clear and moist.  Eyes: Conjunctivae are normal. Pupils are equal, round, and reactive to light.  Neck: Normal range of motion. Neck supple. No JVD present.  Cardiovascular: Normal rate, regular rhythm, S1 normal, S2 normal, normal heart sounds and intact distal pulses.  Exam reveals no gallop and no friction rub.   No murmur heard. Pulmonary/Chest: Effort normal and breath sounds normal. No respiratory distress. He has no wheezes. He has no rales. He exhibits no tenderness.  Abdominal: Soft. Bowel sounds are normal. He exhibits no distension. There is no tenderness.  Musculoskeletal: Normal range of motion. He exhibits no edema and no tenderness.  Lymphadenopathy:    He has no cervical adenopathy.  Neurological: He is alert and oriented to person, place, and time. Coordination normal.  Skin: Skin is warm and dry. No rash noted. No erythema.  Psychiatric: He has a normal mood and affect. His behavior is normal. Judgment and thought content normal.      Assessment and Plan

## 2012-12-05 NOTE — Assessment & Plan Note (Addendum)
Episode of chest pain last week. He has been exercising on a regular basis since that time with no reproducible symptoms. Discussed the various options for him including closely monitoring him for further episodes of chest pain, or stress testing, even cardiac catheterization. He prefers to monitor his symptoms for now. He has been exercising without symptoms. We have suggested he has additional episodes that he call office or go to the emergency room.

## 2012-12-05 NOTE — Assessment & Plan Note (Signed)
We have encouraged continued exercise, careful diet management in an effort to lose weight. 

## 2012-12-05 NOTE — Assessment & Plan Note (Signed)
Blood pressure is well controlled on today's visit. No changes made to the medications. 

## 2012-12-05 NOTE — Assessment & Plan Note (Signed)
Cholesterol is at goal on the current lipid regimen. No changes to the medications were made.  

## 2012-12-26 ENCOUNTER — Ambulatory Visit (INDEPENDENT_AMBULATORY_CARE_PROVIDER_SITE_OTHER): Payer: Medicare Other | Admitting: Family Medicine

## 2012-12-26 DIAGNOSIS — I4891 Unspecified atrial fibrillation: Secondary | ICD-10-CM

## 2012-12-26 DIAGNOSIS — I25810 Atherosclerosis of coronary artery bypass graft(s) without angina pectoris: Secondary | ICD-10-CM

## 2012-12-26 DIAGNOSIS — Z5181 Encounter for therapeutic drug level monitoring: Secondary | ICD-10-CM

## 2012-12-26 DIAGNOSIS — Z23 Encounter for immunization: Secondary | ICD-10-CM

## 2012-12-26 DIAGNOSIS — Z7901 Long term (current) use of anticoagulants: Secondary | ICD-10-CM

## 2012-12-26 NOTE — Patient Instructions (Signed)
Continue 2.5 mg daily except take 5mg  on Tuesdays. Recheck in 6 weeks per pt request.

## 2013-02-06 ENCOUNTER — Ambulatory Visit: Payer: Medicare Other

## 2013-02-14 ENCOUNTER — Ambulatory Visit (INDEPENDENT_AMBULATORY_CARE_PROVIDER_SITE_OTHER): Payer: Medicare Other | Admitting: Family Medicine

## 2013-02-14 ENCOUNTER — Other Ambulatory Visit (INDEPENDENT_AMBULATORY_CARE_PROVIDER_SITE_OTHER): Payer: Medicare Other

## 2013-02-14 DIAGNOSIS — E119 Type 2 diabetes mellitus without complications: Secondary | ICD-10-CM

## 2013-02-14 DIAGNOSIS — Z5181 Encounter for therapeutic drug level monitoring: Secondary | ICD-10-CM

## 2013-02-14 DIAGNOSIS — I25810 Atherosclerosis of coronary artery bypass graft(s) without angina pectoris: Secondary | ICD-10-CM

## 2013-02-14 DIAGNOSIS — I4891 Unspecified atrial fibrillation: Secondary | ICD-10-CM

## 2013-02-14 DIAGNOSIS — Z7901 Long term (current) use of anticoagulants: Secondary | ICD-10-CM

## 2013-02-14 LAB — HEMOGLOBIN A1C: Hgb A1c MFr Bld: 6.4 % (ref 4.6–6.5)

## 2013-02-14 LAB — POCT INR: INR: 2.6

## 2013-02-15 ENCOUNTER — Encounter: Payer: Self-pay | Admitting: *Deleted

## 2013-02-20 ENCOUNTER — Other Ambulatory Visit: Payer: Self-pay | Admitting: Internal Medicine

## 2013-03-28 ENCOUNTER — Other Ambulatory Visit: Payer: Self-pay | Admitting: Internal Medicine

## 2013-03-28 ENCOUNTER — Ambulatory Visit (INDEPENDENT_AMBULATORY_CARE_PROVIDER_SITE_OTHER): Payer: Medicare Other | Admitting: Family Medicine

## 2013-03-28 DIAGNOSIS — I4891 Unspecified atrial fibrillation: Secondary | ICD-10-CM

## 2013-03-28 DIAGNOSIS — Z7901 Long term (current) use of anticoagulants: Secondary | ICD-10-CM

## 2013-03-28 DIAGNOSIS — Z5181 Encounter for therapeutic drug level monitoring: Secondary | ICD-10-CM

## 2013-03-28 DIAGNOSIS — I25810 Atherosclerosis of coronary artery bypass graft(s) without angina pectoris: Secondary | ICD-10-CM

## 2013-03-30 DIAGNOSIS — M5136 Other intervertebral disc degeneration, lumbar region: Secondary | ICD-10-CM

## 2013-03-30 DIAGNOSIS — M51369 Other intervertebral disc degeneration, lumbar region without mention of lumbar back pain or lower extremity pain: Secondary | ICD-10-CM

## 2013-03-30 HISTORY — DX: Other intervertebral disc degeneration, lumbar region without mention of lumbar back pain or lower extremity pain: M51.369

## 2013-03-30 HISTORY — DX: Other intervertebral disc degeneration, lumbar region: M51.36

## 2013-04-24 LAB — HM DIABETES EYE EXAM

## 2013-05-08 ENCOUNTER — Ambulatory Visit (INDEPENDENT_AMBULATORY_CARE_PROVIDER_SITE_OTHER): Payer: Medicare Other | Admitting: Family Medicine

## 2013-05-08 ENCOUNTER — Telehealth: Payer: Self-pay | Admitting: Internal Medicine

## 2013-05-08 DIAGNOSIS — Z7901 Long term (current) use of anticoagulants: Secondary | ICD-10-CM

## 2013-05-08 DIAGNOSIS — Z5181 Encounter for therapeutic drug level monitoring: Secondary | ICD-10-CM

## 2013-05-08 DIAGNOSIS — I25810 Atherosclerosis of coronary artery bypass graft(s) without angina pectoris: Secondary | ICD-10-CM

## 2013-05-08 DIAGNOSIS — I4891 Unspecified atrial fibrillation: Secondary | ICD-10-CM

## 2013-05-08 LAB — POCT INR: INR: 3.7

## 2013-05-08 NOTE — Telephone Encounter (Signed)
Pt dropped off application for handicap placard to be completed. Pt requests that we mail the completed application to him.  I have placed this form on your desk to pass on to Dr. Alphonsus SiasLetvak. Thank you.

## 2013-05-09 NOTE — Telephone Encounter (Signed)
Form done Okay to mail

## 2013-05-09 NOTE — Telephone Encounter (Signed)
I notified patient that I'm mailing form to him.

## 2013-05-23 ENCOUNTER — Encounter: Payer: Medicare Other | Admitting: Internal Medicine

## 2013-05-26 ENCOUNTER — Telehealth: Payer: Self-pay | Admitting: *Deleted

## 2013-05-26 NOTE — Telephone Encounter (Signed)
Pt is supposed to have a root canal on 06/01/13 by Dr. Johny Blamerobert Jepko office# [phone] and Dr. Verdis PrimeJepko would like to know if patient's coumadin level is satisfactory, please advise

## 2013-05-27 NOTE — Telephone Encounter (Signed)
I am not sure what his requirements are  Please assess this situation

## 2013-05-29 NOTE — Telephone Encounter (Signed)
That sounds fine  thanks

## 2013-05-29 NOTE — Telephone Encounter (Signed)
We usually get these instructions from the dentist.    I spoke with Dr. Corky SingJepko's office.  They just wanted to inform us that pt was having a minimally invasive procedure since he is on Coumadin.  He is drilling through the tooth, he will not be touching the gums.  I will make a note to call and check on pt the day after procedure to make sure there are no issues with bleeding and manage him from that point.

## 2013-06-02 NOTE — Telephone Encounter (Signed)
Called to check on pt s/p root canal yesterday.    Pt says he is feeling fine and has had no bleeding issues.

## 2013-06-06 ENCOUNTER — Encounter: Payer: Self-pay | Admitting: *Deleted

## 2013-06-08 ENCOUNTER — Ambulatory Visit: Payer: Medicare Other | Admitting: Cardiovascular Disease

## 2013-06-19 ENCOUNTER — Ambulatory Visit (INDEPENDENT_AMBULATORY_CARE_PROVIDER_SITE_OTHER): Payer: Medicare Other | Admitting: Family Medicine

## 2013-06-19 ENCOUNTER — Other Ambulatory Visit: Payer: Self-pay | Admitting: *Deleted

## 2013-06-19 DIAGNOSIS — I4891 Unspecified atrial fibrillation: Secondary | ICD-10-CM

## 2013-06-19 DIAGNOSIS — Z7901 Long term (current) use of anticoagulants: Secondary | ICD-10-CM

## 2013-06-19 DIAGNOSIS — I25810 Atherosclerosis of coronary artery bypass graft(s) without angina pectoris: Secondary | ICD-10-CM

## 2013-06-19 DIAGNOSIS — Z5181 Encounter for therapeutic drug level monitoring: Secondary | ICD-10-CM

## 2013-06-19 LAB — POCT INR: INR: 3.7

## 2013-06-19 MED ORDER — GLUCOSE BLOOD VI STRP: ORAL_STRIP | Status: AC

## 2013-06-22 ENCOUNTER — Encounter: Payer: Self-pay | Admitting: Cardiovascular Disease

## 2013-06-22 ENCOUNTER — Ambulatory Visit (INDEPENDENT_AMBULATORY_CARE_PROVIDER_SITE_OTHER): Payer: Medicare Other | Admitting: Cardiovascular Disease

## 2013-06-22 VITALS — BP 104/58 | HR 66 | Ht 66.0 in | Wt 151.8 lb

## 2013-06-22 DIAGNOSIS — I10 Essential (primary) hypertension: Secondary | ICD-10-CM

## 2013-06-22 DIAGNOSIS — I495 Sick sinus syndrome: Secondary | ICD-10-CM

## 2013-06-22 DIAGNOSIS — I25810 Atherosclerosis of coronary artery bypass graft(s) without angina pectoris: Secondary | ICD-10-CM

## 2013-06-22 DIAGNOSIS — E785 Hyperlipidemia, unspecified: Secondary | ICD-10-CM

## 2013-06-22 DIAGNOSIS — E119 Type 2 diabetes mellitus without complications: Secondary | ICD-10-CM

## 2013-06-22 MED ORDER — NITROGLYCERIN 0.4 MG SL SUBL: 0.4000 mg | SUBLINGUAL_TABLET | SUBLINGUAL | Status: AC | PRN

## 2013-06-22 NOTE — Patient Instructions (Signed)
You are doing well. No medication changes were made.  Please call us if you have new issues that need to be addressed before your next appt.  Your physician wants you to follow-up in: 6 months.  You will receive a reminder letter in the mail two months in advance. If you don't receive a letter, please call our office to schedule the follow-up appointment.   

## 2013-06-22 NOTE — Progress Notes (Signed)
Patient ID: [name], male    DOB: 1917/07/27, 80 y.o.   MRN: [id number]  HPI Comments: [name] is a very pleasant 80 year old gentleman with a history of bypass surgery in 1994, sick sinus syndrome with pacemaker placement in 2008, diabetes, hypertension, hyperlipidemia presents for routine followup  In followup today, he reports having no further episodes of significant chest pain. He does take nitroglycerin 2 times per year. No significant change in shortness of breath or his energy. His biggest complaint today is severe back pain. He reports having chronic back pain but it has been worse over the past week. He denies having lifted anything heavy or recent trauma. He is trying light stretching but has radiating pain around his bilateral flanks particularly when he coughs.  He has ordered a back brace but it was too small Hemoglobin A1c typically 6.4.  EKG shows normal sinus rhythm with rate 66 beats per minute with no significant ST or T wave changes, rare APCs   Outpatient Encounter Prescriptions as of 06/22/2013  Medication Sig  . amLODipine (NORVASC) 10 MG tablet Take one tablet by mouth one time daily  . Ascorbic Acid (VITAMIN C) 1000 MG tablet Take 1,000 mg by mouth daily.    Marland Kitchen. aspirin 81 MG tablet Take 81 mg by mouth daily.    . B Complex-C-E-Zn (BEC/ZINC) TABS Take 1 tablet by mouth daily.    Marland Kitchen. glimepiride (AMARYL) 1 MG tablet Take one tablet by mouth every morning before breakfast  . glucose blood (ONE TOUCH ULTRA TEST) test strip Use as instructed to test blood sugar twice daily dx: 250.00  . nitroGLYCERIN (NITROSTAT) 0.4 MG SL tablet Place 1 tablet (0.4 mg total) under the tongue every 5 (five) minutes as needed.  . rosuvastatin (CRESTOR) 20 MG tablet Take 20 mg by mouth daily. Take 1/2 daily  . warfarin (COUMADIN) 5 MG tablet TAKE 1 TABLET BY MOUTH EVERY DAY TAKE AS DIRECTED BY COUMADIN CLINIC   . Zn-Pyg Afri-Nettle-Saw Palmet (SAW PALMETTO COMPLEX) CAPS Take 1 capsule  by mouth daily.       Review of Systems  Constitutional: Negative.   HENT: Negative.   Eyes: Negative.   Respiratory: Negative.   Gastrointestinal: Negative.   Endocrine: Negative.   Musculoskeletal: Positive for back pain.  Skin: Negative.   Allergic/Immunologic: Negative.   Neurological: Negative.   Hematological: Negative.   Psychiatric/Behavioral: Negative.   All other systems reviewed and are negative.    BP 104/58  Pulse 66  Ht 5\' 6"  (1.676 m)  Wt 151 lb 12 oz (68.833 kg)  BMI 24.50 kg/m2  Physical Exam  Nursing note and vitals reviewed. Constitutional: He is oriented to person, place, and time. He appears well-developed and well-nourished.  HENT:  Head: Normocephalic.  Nose: Nose normal.  Mouth/Throat: Oropharynx is clear and moist.  Eyes: Conjunctivae are normal. Pupils are equal, round, and reactive to light.  Neck: Normal range of motion. Neck supple. No JVD present.  Cardiovascular: Normal rate, regular rhythm, S1 normal, S2 normal, normal heart sounds and intact distal pulses.  Exam reveals no gallop and no friction rub.   No murmur heard. Pulmonary/Chest: Effort normal and breath sounds normal. No respiratory distress. He has no wheezes. He has no rales. He exhibits no tenderness.  Abdominal: Soft. Bowel sounds are normal. He exhibits no distension. There is no tenderness.  Musculoskeletal: Normal range of motion. He exhibits no edema and no tenderness.  Lymphadenopathy:    He has no  cervical adenopathy.  Neurological: He is alert and oriented to person, place, and time. Coordination normal.  Skin: Skin is warm and dry. No rash noted. No erythema.  Psychiatric: He has a normal mood and affect. His behavior is normal. Judgment and thought content normal.      Assessment and Plan

## 2013-06-22 NOTE — Assessment & Plan Note (Signed)
Relatively well-controlled with hemoglobin A1c 6.4.

## 2013-06-22 NOTE — Assessment & Plan Note (Signed)
Currently with no symptoms of angina. No further workup at this time. Continue current medication regimen. 

## 2013-06-22 NOTE — Assessment & Plan Note (Signed)
Cholesterol is at goal on the current lipid regimen. No changes to the medications were made.  

## 2013-06-22 NOTE — Assessment & Plan Note (Signed)
Status post pacemaker. We will arrange a appointment with Dr. Graciela HusbandsKlein in [place].

## 2013-06-22 NOTE — Assessment & Plan Note (Signed)
Blood pressure is well controlled on today's visit. No changes made to the medications. 

## 2013-06-26 ENCOUNTER — Encounter: Payer: Self-pay | Admitting: Internal Medicine

## 2013-06-26 ENCOUNTER — Ambulatory Visit (INDEPENDENT_AMBULATORY_CARE_PROVIDER_SITE_OTHER): Payer: Medicare Other | Admitting: Internal Medicine

## 2013-06-26 VITALS — BP 140/70 | HR 72 | Wt 152.0 lb

## 2013-06-26 DIAGNOSIS — IMO0002 Reserved for concepts with insufficient information to code with codable children: Secondary | ICD-10-CM

## 2013-06-26 DIAGNOSIS — S239XXA Sprain of unspecified parts of thorax, initial encounter: Secondary | ICD-10-CM

## 2013-06-26 DIAGNOSIS — I25810 Atherosclerosis of coronary artery bypass graft(s) without angina pectoris: Secondary | ICD-10-CM

## 2013-06-26 NOTE — Progress Notes (Signed)
Subjective:    Patient ID: [name], male    DOB: 08-02-1917, 80 y.o.   MRN: [id number]  HPI Here with wife Having back pain--but now seems higher and worse Chronic lumbar pain Now it is around shoulder blades for the past 2 weeks Some anterior low chest pain with cough  No pain when in bed Notices it upon getting up---uses walker to help him get out of bed Hasn't tried the  low back ointment on the higher pain Has been wearing elastic brace on low back  Just saw Dr Mariah Milling Was wondering about trying a brace  Not a lot of cough--only occasional No SOB Stopped exercise after Dr Windell Hummingbird visit---he though it might be muscular  No known injuries--no change in his usual routines before this stopped  Current Outpatient Prescriptions on File Prior to Visit  Medication Sig Dispense Refill  . amLODipine (NORVASC) 10 MG tablet Take one tablet by mouth one time daily  90 tablet  2  . Ascorbic Acid (VITAMIN C) 1000 MG tablet Take 1,000 mg by mouth daily.        Marland Kitchen aspirin 81 MG tablet Take 81 mg by mouth daily.        . B Complex-C-E-Zn (BEC/ZINC) TABS Take 1 tablet by mouth daily.        Marland Kitchen glimepiride (AMARYL) 1 MG tablet Take one tablet by mouth every morning before breakfast  90 tablet  2  . glucose blood (ONE TOUCH ULTRA TEST) test strip Use as instructed to test blood sugar twice daily dx: 250.00  200 each  3  . nitroGLYCERIN (NITROSTAT) 0.4 MG SL tablet Place 1 tablet (0.4 mg total) under the tongue every 5 (five) minutes as needed.  25 tablet  11  . rosuvastatin (CRESTOR) 20 MG tablet Take 20 mg by mouth daily. Take 1/2 daily      . warfarin (COUMADIN) 5 MG tablet TAKE 1 TABLET BY MOUTH EVERY DAY TAKE AS DIRECTED BY COUMADIN CLINIC   90 tablet  0  . Zn-Pyg Afri-Nettle-Saw Palmet (SAW PALMETTO COMPLEX) CAPS Take 1 capsule by mouth daily.         No current facility-administered medications on file prior to visit.    Allergies  Allergen Reactions  . Cefuroxime Axetil    REACTION: unspecified  . Heparin     REACTION: rash    Past Medical History  Diagnosis Date  . CAD (coronary artery disease) 1974    Dr. Riley Kill   . DM2 (diabetes mellitus, type 2) 2003  . HTN (hypertension)   . BPH (benign prostatic hypertrophy)   . Thyroid cyst     incidental finding on ultra sound  . Atrial fibrillation   . Hyperlipidemia   . SSS (sick sinus syndrome)     Past Surgical History  Procedure Laterality Date  . Coronary artery bypass graft  1994  . Permanent pacemaker  04/09/06    dual chamber permanent pacemaker  . Tonsillectomy      childhood  . Pilonidal cystectomy      Family History  Problem Relation Age of Onset  . Colon cancer Neg Hx   . Prostate cancer Neg Hx     History   Social History  . Marital Status: Married    Spouse Name: N/A    Number of Children: N/A  . Years of Education: N/A   Occupational History  . retired Airline pilot for [employer]    Social History Main Topics  .  Smoking status: Former Games developermoker  . Smokeless tobacco: Never Used  . Alcohol Use: No  . Drug Use: No  . Sexual Activity: Not on file   Other Topics Concern  . Not on file   Social History Narrative    Has a living will- wife or attorney Zebedee IbaRon Johnson to have health care POA. Would accept resuscitation but no prolonged machines, not sure about feeding tube.            Review of Systems No fever Not sick    Objective:   Physical Exam  Constitutional: He appears well-developed and well-nourished. No distress.  Cardiovascular: Normal rate, regular rhythm and normal heart sounds.  Exam reveals no gallop.   No murmur heard. Regular with rare skip  Pulmonary/Chest: Effort normal and breath sounds normal. No respiratory distress. He has no wheezes. He has no rales.  Musculoskeletal:  No spine or specific rib tenderness Pain is along low thoracic flanks to axillae Partially frozen right shoulder--not source of pain  Neurological:  Normal gait and strength            Assessment & Plan:

## 2013-06-26 NOTE — Assessment & Plan Note (Signed)
Reassured Discussed changing to some isometric exercises --like planks--instead of the heavy movements he does with legs Continue heat and tylenol I don't recommend a brace

## 2013-06-26 NOTE — Progress Notes (Signed)
Pre visit review using our clinic review tool, if applicable. No additional management support is needed unless otherwise documented below in the visit note. 

## 2013-06-28 DIAGNOSIS — M1120 Other chondrocalcinosis, unspecified site: Secondary | ICD-10-CM

## 2013-06-28 HISTORY — DX: Other chondrocalcinosis, unspecified site: M11.20

## 2013-06-29 ENCOUNTER — Encounter: Payer: Self-pay | Admitting: Family Medicine

## 2013-06-29 ENCOUNTER — Ambulatory Visit (INDEPENDENT_AMBULATORY_CARE_PROVIDER_SITE_OTHER): Payer: Medicare Other | Admitting: Family Medicine

## 2013-06-29 ENCOUNTER — Ambulatory Visit (INDEPENDENT_AMBULATORY_CARE_PROVIDER_SITE_OTHER)
Admission: RE | Admit: 2013-06-29 | Discharge: 2013-06-29 | Disposition: A | Discharge status: Home / Self Care | Payer: Medicare Other | Source: Ambulatory Visit | Attending: Family Medicine | Admitting: Family Medicine

## 2013-06-29 VITALS — BP 120/60 | HR 83 | Wt 150.0 lb

## 2013-06-29 DIAGNOSIS — M546 Pain in thoracic spine: Secondary | ICD-10-CM

## 2013-06-29 DIAGNOSIS — M545 Low back pain, unspecified: Secondary | ICD-10-CM

## 2013-06-29 DIAGNOSIS — I25810 Atherosclerosis of coronary artery bypass graft(s) without angina pectoris: Secondary | ICD-10-CM

## 2013-06-29 MED ORDER — DEXAMETHASONE SODIUM PHOSPHATE 10 MG/ML IJ SOLN
10.0000 mg | Freq: Once | INTRAMUSCULAR | Status: AC
Start: 2013-06-29 — End: 2013-06-29
  Administered 2013-06-29: 10 mg via INTRAMUSCULAR

## 2013-06-29 MED ORDER — ROSUVASTATIN CALCIUM 20 MG PO TABS: 10.0000 mg | ORAL_TABLET | Freq: Every day | ORAL | Status: AC

## 2013-06-29 NOTE — Progress Notes (Signed)
Date:  06/29/2013   Name:  [name]   DOB:  [birth year]   MRN:  [id number]  Primary Physician:  Tillman Abide, MD   Chief Complaint: Back Pain   Subjective:   History of Present Illness:  [name] is a 80 y.o. pleasant patient who presents with the following:  F/u back pain: The patient saw my partner several days ago with low back and thoracic back pain. His symptoms have continued, and he is here for my advice today. He is chronically on Coumadin. He is not having any numbness or tingling, and he has been active for his 89 years. He does have some pain now and try to do some core and leg rehabilitation exercises. He has done some basic points which have seemed to help for him.  He does seem to help a little bit also  Has been walking for about 40 years and then about 10 years ago, started to get some linaments and would take a couple of tylenol.   Using some shoulder blades and coughing in his chest. Has been using a heating pad and pain almost all the time.   Taking Tylenol about  4 grams a day.  IM decadron PT L-spine xray   Patient Active Problem List   Diagnosis Date Noted  . Thoracic sprain and strain 06/26/2013  . Encounter for therapeutic drug monitoring 05/08/2013  . Coronary artery disease 10/11/2010  . CARDIAC PACEMAKER IN SITU 02/28/2010  . CAD, ARTERY BYPASS GRAFT 08/31/2008  . PAROXYSMAL ATRIAL FIBRILLATION 08/31/2008  . SICK SINUS SYNDROME 08/31/2008  . HYPERLIPIDEMIA 12/21/2007  . THYROID CYST 05/29/2007  . DIABETES MELLITUS, TYPE II 07/13/2006  . HYPERTENSION 07/13/2006  . BENIGN PROSTATIC HYPERTROPHY 07/13/2006    Past Medical History  Diagnosis Date  . CAD (coronary artery disease) 1974    Dr. Riley Kill   . DM2 (diabetes mellitus, type 2) 2003  . HTN (hypertension)   . BPH (benign prostatic hypertrophy)   . Thyroid cyst     incidental finding on ultra sound  . Atrial fibrillation   . Hyperlipidemia   . SSS (sick sinus syndrome)       Past Surgical History  Procedure Laterality Date  . Coronary artery bypass graft  1994  . Permanent pacemaker  04/09/06    dual chamber permanent pacemaker  . Tonsillectomy      childhood  . Pilonidal cystectomy      History   Social History  . Marital Status: Married    Spouse Name: N/A    Number of Children: N/A  . Years of Education: N/A   Occupational History  . retired Airline pilot for [employer]    Social History Main Topics  . Smoking status: Former Games developer  . Smokeless tobacco: Never Used  . Alcohol Use: No  . Drug Use: No  . Sexual Activity: Not on file   Other Topics Concern  . Not on file   Social History Narrative    Has a living will- wife or attorney [name] to have health care POA. Would accept resuscitation but no prolonged machines, not sure about feeding tube.             Family History  Problem Relation Age of Onset  . Colon cancer Neg Hx   . Prostate cancer Neg Hx     Allergies  Allergen Reactions  . Cefuroxime Axetil     REACTION: unspecified  . Heparin     REACTION:  rash    Medication list has been reviewed and updated.  Review of Systems:  GEN: No fevers, chills. Nontoxic. Primarily MSK c/o today. MSK: Detailed in the HPI GI: tolerating PO intake without difficulty Neuro: No numbness, parasthesias, or tingling associated. Otherwise the pertinent positives of the ROS are noted above.   Objective:   Physical Examination: BP 120/60  Pulse 83  Wt 150 lb (68.04 kg)  SpO2 94%  Ideal Body Weight:     GEN: WDWN, NAD, Non-toxic, Alert & Oriented x 3 HEENT: Atraumatic, Normocephalic.  Ears and Nose: No external deformity. EXTR: No clubbing/cyanosis/edema NEURO: Normal gait.  PSYCH: Normally interactive. Conversant. Not depressed or anxious appearing.  Calm demeanor.    Nontender throughout thoracic through lumbar spinous processes. There is some tenderness around the area of T8-T12 medial and lateral to this. This is  consistent with muscular tenderness and spasm. Nontender at the SI joints. Trochanteric bursa are nontender. Neurovascularly intact from a lower extremity standpoint.  No results found.  Assessment & Plan:   Low back pain - Plan: Ambulatory referral to Physical Therapy, DG Lumbar Spine Complete, dexamethasone (DECADRON) injection 10 mg  Thoracic back pain - Plan: Ambulatory referral to Physical Therapy, DG Lumbar Spine Complete, dexamethasone (DECADRON) injection 10 mg  Physical therapy at [facility], IM Decadron 10 mg, and check lumbar spine film. Continue heat as needed  No Follow-up on file.  Orders Placed This Encounter  Procedures  . DG Lumbar Spine Complete  . Ambulatory referral to Physical Therapy   Patient's Medications  New Prescriptions   No medications on file  Previous Medications   AMLODIPINE (NORVASC) 10 MG TABLET    Take one tablet by mouth one time daily   ASCORBIC ACID (VITAMIN C) 1000 MG TABLET    Take 1,000 mg by mouth daily.     ASPIRIN 81 MG TABLET    Take 81 mg by mouth daily.     B COMPLEX-C-E-ZN (BEC/ZINC) TABS    Take 1 tablet by mouth daily.     GLIMEPIRIDE (AMARYL) 1 MG TABLET    Take one tablet by mouth every morning before breakfast   GLUCOSE BLOOD (ONE TOUCH ULTRA TEST) TEST STRIP    Use as instructed to test blood sugar twice daily dx: 250.00   NITROGLYCERIN (NITROSTAT) 0.4 MG SL TABLET    Place 1 tablet (0.4 mg total) under the tongue every 5 (five) minutes as needed.   WARFARIN (COUMADIN) 5 MG TABLET    TAKE 1 TABLET BY MOUTH EVERY DAY TAKE AS DIRECTED BY COUMADIN CLINIC    ZN-PYG AFRI-NETTLE-SAW PALMET (SAW PALMETTO COMPLEX) CAPS    Take 1 capsule by mouth daily.    Modified Medications   Modified Medication Previous Medication   ROSUVASTATIN (CRESTOR) 20 MG TABLET rosuvastatin (CRESTOR) 20 MG tablet      Take 0.5 tablets (10 mg total) by mouth daily.    Take 10 mg by mouth daily.   Discontinued Medications   No medications on file   There are  no Patient Instructions on file for this visit.  Signed,  Elpidio GaleaSpencer T. Jalacia Mattila, MD, CAQ Sports Medicine  [facility] [address] [place] [address] Phone: [phone] Fax: [phone]

## 2013-07-04 ENCOUNTER — Ambulatory Visit (INDEPENDENT_AMBULATORY_CARE_PROVIDER_SITE_OTHER): Payer: Medicare Other | Admitting: Internal Medicine

## 2013-07-04 ENCOUNTER — Encounter: Payer: Self-pay | Admitting: Internal Medicine

## 2013-07-04 VITALS — BP 117/66 | HR 62 | Ht 66.0 in | Wt 146.8 lb

## 2013-07-04 DIAGNOSIS — I495 Sick sinus syndrome: Secondary | ICD-10-CM

## 2013-07-04 DIAGNOSIS — I25810 Atherosclerosis of coronary artery bypass graft(s) without angina pectoris: Secondary | ICD-10-CM

## 2013-07-04 LAB — MDC_IDC_ENUM_SESS_TYPE_INCLINIC
Battery Remaining Longevity: 29 mo
Brady Statistic RA Percent Paced: 72 %
Brady Statistic RV Percent Paced: 19 %
Implantable Pulse Generator Model: 1297
Implantable Pulse Generator Serial Number: 318781
Lead Channel Pacing Threshold Amplitude: 1 V
Lead Channel Pacing Threshold Pulse Width: 0.4 ms
Lead Channel Sensing Intrinsic Amplitude: 2.2 mV
Lead Channel Sensing Intrinsic Amplitude: 9.9 mV
Lead Channel Setting Pacing Amplitude: 2 V
Lead Channel Setting Pacing Amplitude: 2.5 V
Lead Channel Setting Pacing Pulse Width: 0.4 ms
MDC IDC MSMT LEADCHNL RA IMPEDANCE VALUE: 420 Ohm
MDC IDC MSMT LEADCHNL RA PACING THRESHOLD AMPLITUDE: 0.9 V
MDC IDC MSMT LEADCHNL RA PACING THRESHOLD PULSEWIDTH: 0.4 ms
MDC IDC MSMT LEADCHNL RV IMPEDANCE VALUE: 470 Ohm
MDC IDC SET LEADCHNL RV SENSING SENSITIVITY: 2.5 mV

## 2013-07-04 NOTE — Progress Notes (Signed)
      Patient Care Team: Karie Schwalbeichard I Letvak, MD as PCP - General   HPI  [name] is a 80 y.o. male Seen in followup for pacemaker implanted in 2008 for sick sinus syndrome.  His history of coronary artery disease with prior bypass surgery.  His chronic back pain. No chest pain. Breathing is stable  Past Medical History  Diagnosis Date  . CAD (coronary artery disease) 1974    Dr. Riley KillStuckey   . DM2 (diabetes mellitus, type 2) 2003  . HTN (hypertension)   . BPH (benign prostatic hypertrophy)   . Thyroid cyst     incidental finding on ultra sound  . Atrial fibrillation   . Hyperlipidemia   . SSS (sick sinus syndrome)     Past Surgical History  Procedure Laterality Date  . Coronary artery bypass graft  1994  . Permanent pacemaker  04/09/06    dual chamber permanent pacemaker  . Tonsillectomy      childhood  . Pilonidal cystectomy      Current Outpatient Prescriptions  Medication Sig Dispense Refill  . amLODipine (NORVASC) 10 MG tablet Take one tablet by mouth one time daily  90 tablet  2  . Ascorbic Acid (VITAMIN C) 1000 MG tablet Take 1,000 mg by mouth daily.        Marland Kitchen. aspirin 81 MG tablet Take 81 mg by mouth daily.        . B Complex-C-E-Zn (BEC/ZINC) TABS Take 1 tablet by mouth daily.        Marland Kitchen. glimepiride (AMARYL) 1 MG tablet Take one tablet by mouth every morning before breakfast  90 tablet  2  . glucose blood (ONE TOUCH ULTRA TEST) test strip Use as instructed to test blood sugar twice daily dx: 250.00  200 each  3  . nitroGLYCERIN (NITROSTAT) 0.4 MG SL tablet Place 1 tablet (0.4 mg total) under the tongue every 5 (five) minutes as needed.  25 tablet  11  . rosuvastatin (CRESTOR) 20 MG tablet Take 0.5 tablets (10 mg total) by mouth daily.  15 tablet  0  . warfarin (COUMADIN) 5 MG tablet TAKE 1 TABLET BY MOUTH EVERY DAY TAKE AS DIRECTED BY COUMADIN CLINIC   90 tablet  0  . Zn-Pyg Afri-Nettle-Saw Palmet (SAW PALMETTO COMPLEX) CAPS Take 1 capsule by mouth daily.          No current facility-administered medications for this visit.    Allergies  Allergen Reactions  . Cefuroxime Axetil     REACTION: unspecified  . Heparin     REACTION: rash    Review of Systems negative except from HPI and PMH  Physical Exam BP 117/66  Pulse 62  Ht 5\' 6"  (1.676 m)  Wt 146 lb 12 oz (66.565 kg)  BMI 23.70 kg/m2 Well developed and nourished in no acute distress HENT normal Neck supple \ Device pocket well healed; without hematoma or erythema.  There is no tethering Clear Regular rate and rhythm, no murmurs or gallops Abd-soft with active BS No Clubbing cyanosis edema Skin-warm and dry A & Oriented  Grossly normal sensory and motor function    Assessment and  Plan  Sinus node dysfunction   pacemaker-[employer] The patient's device was interrogated.  The information was reviewed. No changes were made in the programming.

## 2013-07-04 NOTE — Patient Instructions (Signed)
Your physician wants you to follow-up in: 6 months with device clinic. You will receive a reminder letter in the mail two months in advance. If you don't receive a letter, please call our office to schedule the follow-up appointment.  Your physician wants you to follow-up in: 1 year with Dr. Klein. You will receive a reminder letter in the mail two months in advance. If you don't receive a letter, please call our office to schedule the follow-up appointment.  

## 2013-07-05 ENCOUNTER — Other Ambulatory Visit: Payer: Self-pay | Admitting: Family Medicine

## 2013-07-05 DIAGNOSIS — R937 Abnormal findings on diagnostic imaging of other parts of musculoskeletal system: Secondary | ICD-10-CM

## 2013-07-05 DIAGNOSIS — I700 Atherosclerosis of aorta: Secondary | ICD-10-CM

## 2013-07-06 ENCOUNTER — Other Ambulatory Visit: Payer: Self-pay | Admitting: Internal Medicine

## 2013-07-06 ENCOUNTER — Telehealth: Payer: Self-pay | Admitting: *Deleted

## 2013-07-06 MED ORDER — TRAMADOL HCL 50 MG PO TABS: 25.0000 mg | ORAL_TABLET | Freq: Three times a day (TID) | ORAL | Status: DC | PRN

## 2013-07-06 NOTE — Telephone Encounter (Signed)
Pt calling stating he's in EXTREME PAIN and the tylenol is not helping at all and would like something stronger ASAP, [facility], does not want PT because of the pain. Please advise

## 2013-07-06 NOTE — Telephone Encounter (Signed)
Ongoing aching Sometimes hurts to take deep breath Will refer up to his shoulder at times Initially uncomfortable in bed but settles quickly (like 5 minutes) and able to sleep  Will try tramadol for now No gabapentin but that could be considered if no help with tramadol

## 2013-07-11 ENCOUNTER — Ambulatory Visit (INDEPENDENT_AMBULATORY_CARE_PROVIDER_SITE_OTHER): Payer: Medicare Other | Admitting: Adult Health

## 2013-07-11 ENCOUNTER — Encounter: Payer: Self-pay | Admitting: Adult Health

## 2013-07-11 VITALS — BP 128/64 | HR 85 | Temp 99.0°F | Resp 14 | Wt 148.0 lb

## 2013-07-11 DIAGNOSIS — T148XXA Other injury of unspecified body region, initial encounter: Secondary | ICD-10-CM

## 2013-07-11 DIAGNOSIS — Z7901 Long term (current) use of anticoagulants: Secondary | ICD-10-CM

## 2013-07-11 DIAGNOSIS — I25810 Atherosclerosis of coronary artery bypass graft(s) without angina pectoris: Secondary | ICD-10-CM

## 2013-07-11 MED ORDER — DOXYCYCLINE HYCLATE 100 MG PO TABS: 100.0000 mg | ORAL_TABLET | Freq: Two times a day (BID) | ORAL | Status: DC

## 2013-07-11 NOTE — Patient Instructions (Signed)
  Do not use the heating pad.  I have applied a transparent dressing over the blisters. This dressing will follow off on its own.  Start doxycycline 1 capsule twice a day for 10 days.  Return to clinic on Friday morning to have your PT/INR checked. The antibiotic can make the Coumadin stronger. Once I obtain the results of your PT/INR, I will call you with any changes to her medication.  Please schedule a followup appointment in one week to have the sore checked by Dr. Alphonsus SiasLetvak. Call if any problems or concerns prior to that visit.

## 2013-07-11 NOTE — Progress Notes (Signed)
Pre visit review using our clinic review tool, if applicable. No additional management support is needed unless otherwise documented below in the visit note. 

## 2013-07-11 NOTE — Progress Notes (Signed)
Patient ID: [name], male   DOB: Oct 09, 1917, 80 y.o.   MRN: [id number]    Subjective:    Patient ID: [name] DrapeRussell H [name], male    DOB: Oct 09, 1917, 80 y.o.   MRN: [id number]  HPI  Patient is a pleasant 80 year old male who presents to clinic with his wife. Patient reports noticing a "sore on his back" this morning. He was recently seen for thoracic and low back pain. In treating his pain, patient was using a heating pad. The temperature of a heating pad was quite high. He denies pain in this area. No fever, chills. He is here for further evaluation.   Past Medical History  Diagnosis Date  . CAD (coronary artery disease) 1974    Dr. Riley KillStuckey   . DM2 (diabetes mellitus, type 2) 2003  . HTN (hypertension)   . BPH (benign prostatic hypertrophy)   . Thyroid cyst     incidental finding on ultra sound  . Atrial fibrillation   . Hyperlipidemia   . SSS (sick sinus syndrome)     Current Outpatient Prescriptions on File Prior to Visit  Medication Sig Dispense Refill  . amLODipine (NORVASC) 10 MG tablet Take one tablet by mouth one time daily  90 tablet  2  . Ascorbic Acid (VITAMIN C) 1000 MG tablet Take 1,000 mg by mouth daily.        Marland Kitchen. aspirin 81 MG tablet Take 81 mg by mouth daily.        . B Complex-C-E-Zn (BEC/ZINC) TABS Take 1 tablet by mouth daily.        Marland Kitchen. glimepiride (AMARYL) 1 MG tablet TAKE ONE TABLET BY MOUTH EVERY MORNING  BEDFORE  BREAKFAST    90 tablet  1  . glucose blood (ONE TOUCH ULTRA TEST) test strip Use as instructed to test blood sugar twice daily dx: 250.00  200 each  3  . nitroGLYCERIN (NITROSTAT) 0.4 MG SL tablet Place 1 tablet (0.4 mg total) under the tongue every 5 (five) minutes as needed.  25 tablet  11  . rosuvastatin (CRESTOR) 20 MG tablet Take 0.5 tablets (10 mg total) by mouth daily.  15 tablet  0  . traMADol (ULTRAM) 50 MG tablet Take 0.5-1 tablets (25-50 mg total) by mouth 3 (three) times daily as needed.  60 tablet  0  . warfarin (COUMADIN) 5 MG tablet TAKE  1 TABLET BY MOUTH EVERY DAY TAKE AS DIRECTED BY COUMADIN CLINIC   90 tablet  0  . Zn-Pyg Afri-Nettle-Saw Palmet (SAW PALMETTO COMPLEX) CAPS Take 1 capsule by mouth daily.         No current facility-administered medications on file prior to visit.     Review of Systems  Constitutional: Negative for fever and chills.  Respiratory: Negative.   Cardiovascular: Negative.   Musculoskeletal: Positive for back pain (use a heating pad to help alleviate pain).  Skin:       Pt noticed a sore on the left side of his lower back.  Psychiatric/Behavioral: Negative.   All other systems reviewed and are negative.      Objective:  BP 128/64  Pulse 85  Temp(Src) 99 F (37.2 C) (Oral)  Resp 14  Wt 148 lb (67.132 kg)  SpO2 96%   Physical Exam  Constitutional:  Please 80 y/o male, NAD. Appears somewhat uncomfortable  Skin: There is erythema.     Erythema noted throughout his back. Very dry skin, irritated. Appears to be 2/2 the use of very hot heating  pad that he leaves on for extended periods of time to alleviate back pain.      Assessment & Plan:   1. Blood blister There are 2 blisters side by side on his left lower back. The first one is ~ 1cm and right next to it is a 2 cm blister. Pt has been leaving a very hot heating pad to alleviate his back pain. Instructed not to use the heating pad on his back until his skin heals. Then he can use but at a lower temperature and only for 15 mins at a time. The surrounding area was cleaned thoroughly. It was covered with tegaderm for easy observation and to protect from friction given the location of blisters. Instructed pt/wife not to remove the tegaderm. It will come off on its own. This dressing will allow area to breathe and heal while protecting from further breakdown.  Apply ice (not directly of the skin) for 15 min at a time. Start antibiotic to treat empirically for infection - Doxycycline 100 bid x 10 days. Follow up with PCP in 1 week for  evaluation of blister. Sooner if necessary.  2. Long term (current) use of anticoagulants RTC on Friday for PT/INR since started on Doxy. Adjust coumadin dose as needed. - INR/PT; Future  Note, greater than 30 min were spent in face to face communication in the education, assessment, evaluation, planning and implementation of care pertaining to this problem.

## 2013-07-12 ENCOUNTER — Encounter: Payer: Self-pay | Admitting: Internal Medicine

## 2013-07-13 ENCOUNTER — Telehealth: Payer: Self-pay | Admitting: Internal Medicine

## 2013-07-13 NOTE — Telephone Encounter (Signed)
Pt's brother called and said that Perlie GoldRussell needs a back doctor.  He is not on the patient's designated party release form, so I was unable to discuss this with him.  His brother was mad that I wouldn't set Daivik up with a "back doctor".  I told him I was sorry he was upset.  I noticed patient has an appt next week so I think if he needs a referral that can be done at has appt.

## 2013-07-13 NOTE — Telephone Encounter (Signed)
After speaking with Shirlee LimerickMarion, pt is scheduled for a ultrasound on 07/21/2013 and has appt on 07/18/13 with Dr. Alphonsus SiasLetvak. Pt was also seen by Dr. Patsy Lageropland.

## 2013-07-13 NOTE — Telephone Encounter (Signed)
.  left message to have patient return my call.  

## 2013-07-14 ENCOUNTER — Telehealth: Payer: Self-pay

## 2013-07-14 ENCOUNTER — Other Ambulatory Visit: Payer: Medicare Other

## 2013-07-14 NOTE — Telephone Encounter (Signed)
Pt was admitted in the hospital, see H&P in your inbox

## 2013-07-14 NOTE — Telephone Encounter (Signed)
[name] GeorgiaBarb [name] with [facility] left v/m; pt presently at [facility] and [facility] got [facility] and pt has hx of diabetes with traditional medicare and medicare requires [facility]. Lesle ReekBarb wants to know last A1C. Last A1C was 02/14/13/. [name] notified and will have A1C done while pt is at hospital.

## 2013-07-14 NOTE — Telephone Encounter (Signed)
.  left message to have patient return my call.  

## 2013-07-17 ENCOUNTER — Telehealth: Payer: Self-pay

## 2013-07-17 ENCOUNTER — Ambulatory Visit (INDEPENDENT_AMBULATORY_CARE_PROVIDER_SITE_OTHER): Payer: Medicare Other | Admitting: General Practice

## 2013-07-17 DIAGNOSIS — Z5181 Encounter for therapeutic drug level monitoring: Secondary | ICD-10-CM

## 2013-07-17 DIAGNOSIS — I25810 Atherosclerosis of coronary artery bypass graft(s) without angina pectoris: Secondary | ICD-10-CM

## 2013-07-17 DIAGNOSIS — I4891 Unspecified atrial fibrillation: Secondary | ICD-10-CM

## 2013-07-17 LAB — POCT INR: INR: 3.7

## 2013-07-17 NOTE — Progress Notes (Signed)
Pre visit review using our clinic review tool, if applicable. No additional management support is needed unless otherwise documented below in the visit note. 

## 2013-07-17 NOTE — Telephone Encounter (Signed)
[name] nurse with [facility] left v/m with PT/INR results; PT was 44.3 and INR 3.7; pt took coumadin 2.5 mg last night. [name] request cb.

## 2013-07-17 NOTE — Telephone Encounter (Signed)
appt 12:15 tomorrow Will review all this at that time

## 2013-07-17 NOTE — Telephone Encounter (Signed)
Spoke with patient and wife and they are very upset about the phone call that [name] brother made, they are embarrassed and hurt by his words to [name]. The pt's brother left them at the hospital on Saturday without a ride home. Pt has an appt here tomorrow at 12:30 and would like to [facility] and [name]

## 2013-07-18 ENCOUNTER — Telehealth: Payer: Self-pay

## 2013-07-18 ENCOUNTER — Encounter: Payer: Self-pay | Admitting: Internal Medicine

## 2013-07-18 ENCOUNTER — Ambulatory Visit (INDEPENDENT_AMBULATORY_CARE_PROVIDER_SITE_OTHER): Payer: Medicare Other | Admitting: Internal Medicine

## 2013-07-18 VITALS — BP 120/60 | HR 69 | Temp 98.4°F | Wt 140.0 lb

## 2013-07-18 DIAGNOSIS — M5137 Other intervertebral disc degeneration, lumbosacral region: Secondary | ICD-10-CM

## 2013-07-18 DIAGNOSIS — T148XXA Other injury of unspecified body region, initial encounter: Secondary | ICD-10-CM

## 2013-07-18 DIAGNOSIS — M1120 Other chondrocalcinosis, unspecified site: Secondary | ICD-10-CM | POA: Insufficient documentation

## 2013-07-18 DIAGNOSIS — M51369 Other intervertebral disc degeneration, lumbar region without mention of lumbar back pain or lower extremity pain: Secondary | ICD-10-CM | POA: Insufficient documentation

## 2013-07-18 DIAGNOSIS — M5136 Other intervertebral disc degeneration, lumbar region: Secondary | ICD-10-CM | POA: Insufficient documentation

## 2013-07-18 DIAGNOSIS — I25810 Atherosclerosis of coronary artery bypass graft(s) without angina pectoris: Secondary | ICD-10-CM

## 2013-07-18 MED ORDER — COLCHICINE 0.6 MG PO TABS: 0.6000 mg | ORAL_TABLET | Freq: Two times a day (BID) | ORAL | Status: AC | PRN

## 2013-07-18 NOTE — Progress Notes (Signed)
Subjective:    Patient ID: [name], male    DOB: 06-04-1917, 80 y.o.   MRN: [id number]  HPI Here with wife Apologizing for his brother's call  [facility] Bad right knee pain Arthrocentesis showed pseudogout Put on ibuprofen and colchicine Not eating since then  Back is better Still uses the tramadol as needed  Current Outpatient Prescriptions on File Prior to Visit  Medication Sig Dispense Refill  . amLODipine (NORVASC) 10 MG tablet Take one tablet by mouth one time daily  90 tablet  2  . Ascorbic Acid (VITAMIN C) 1000 MG tablet Take 1,000 mg by mouth daily.        Marland Kitchen. aspirin 81 MG tablet Take 81 mg by mouth daily.        . B Complex-C-E-Zn (BEC/ZINC) TABS Take 1 tablet by mouth daily.        Marland Kitchen. doxycycline (VIBRA-TABS) 100 MG tablet Take 1 tablet (100 mg total) by mouth 2 (two) times daily.  20 tablet  0  . glimepiride (AMARYL) 1 MG tablet TAKE ONE TABLET BY MOUTH EVERY MORNING  BEDFORE  BREAKFAST    90 tablet  1  . glucose blood (ONE TOUCH ULTRA TEST) test strip Use as instructed to test blood sugar twice daily dx: 250.00  200 each  3  . nitroGLYCERIN (NITROSTAT) 0.4 MG SL tablet Place 1 tablet (0.4 mg total) under the tongue every 5 (five) minutes as needed.  25 tablet  11  . rosuvastatin (CRESTOR) 20 MG tablet Take 0.5 tablets (10 mg total) by mouth daily.  15 tablet  0  . traMADol (ULTRAM) 50 MG tablet Take 0.5-1 tablets (25-50 mg total) by mouth 3 (three) times daily as needed.  60 tablet  0  . warfarin (COUMADIN) 5 MG tablet TAKE 1 TABLET BY MOUTH EVERY DAY TAKE AS DIRECTED BY COUMADIN CLINIC   90 tablet  0  . Zn-Pyg Afri-Nettle-Saw Palmet (SAW PALMETTO COMPLEX) CAPS Take 1 capsule by mouth daily.         No current facility-administered medications on file prior to visit.    Allergies  Allergen Reactions  . Cefuroxime Axetil     REACTION: unspecified  . Heparin     REACTION: rash    Past Medical History  Diagnosis Date  . CAD (coronary artery disease)  1974    Dr. Riley KillStuckey   . DM2 (diabetes mellitus, type 2) 2003  . HTN (hypertension)   . BPH (benign prostatic hypertrophy)   . Thyroid cyst     incidental finding on ultra sound  . Atrial fibrillation   . Hyperlipidemia   . SSS (sick sinus syndrome)     Past Surgical History  Procedure Laterality Date  . Coronary artery bypass graft  1994  . Permanent pacemaker  04/09/06    dual chamber permanent pacemaker  . Tonsillectomy      childhood  . Pilonidal cystectomy      Family History  Problem Relation Age of Onset  . Colon cancer Neg Hx   . Prostate cancer Neg Hx     History   Social History  . Marital Status: Married    Spouse Name: N/A    Number of Children: N/A  . Years of Education: N/A   Occupational History  . retired Airline pilotsales for [employer]    Social History Main Topics  . Smoking status: Former Games developermoker  . Smokeless tobacco: Never Used  . Alcohol Use: No  . Drug  Use: No  . Sexual Activity: Not on file   Other Topics Concern  . Not on file   Social History Narrative    Has a living will- wife or attorney Zebedee IbaRon Johnson to have health care POA. Would accept resuscitation but no prolonged machines, not sure about feeding tube.            Review of Systems Had some cough Lost appetite and has been losing weight Discussed trying ensure    Objective:   Physical Exam  Constitutional: He appears well-developed and well-nourished. No distress.  Musculoskeletal:  Walks well but slightly flexed on back No swelling in right knee now. Good ROM and no inflammation  Skin:  2 small ulcers with clean eschar right low back          Assessment & Plan:

## 2013-07-18 NOTE — Progress Notes (Signed)
Pre visit review using our clinic review tool, if applicable. No additional management support is needed unless otherwise documented below in the visit note. 

## 2013-07-18 NOTE — Patient Instructions (Signed)
Please stop doxycycline and ibuprofen Use the colchicine only if your knee starts acting up again

## 2013-07-18 NOTE — Assessment & Plan Note (Signed)
New diagnosis Better now Will keep the colchicine for prn use Stop the ibuprofen as it has caused stopped stomach troubles (and perhaps the colchicine) Also stop doxy--no reason for this and could cause the stomach issues

## 2013-07-18 NOTE — Assessment & Plan Note (Signed)
This is healing well.

## 2013-07-18 NOTE — Telephone Encounter (Signed)
[name] with [facility] left v/m; pt was prescribed Colcrys 0.6 mg and cost to pt is $310.73. Request less expensive med.Please advise.

## 2013-07-18 NOTE — Assessment & Plan Note (Signed)
Has improved Using the tramadol as needed

## 2013-07-19 NOTE — Telephone Encounter (Signed)
Need to check other places then--- I think it might be less elsewhere (not sure if there is any generic colchicine)

## 2013-07-19 NOTE — Telephone Encounter (Signed)
Spoke with patient and advised results, they will pay out of pocket

## 2013-07-21 ENCOUNTER — Encounter (INDEPENDENT_AMBULATORY_CARE_PROVIDER_SITE_OTHER): Payer: Medicare Other

## 2013-07-21 DIAGNOSIS — I723 Aneurysm of iliac artery: Secondary | ICD-10-CM

## 2013-07-21 DIAGNOSIS — I700 Atherosclerosis of aorta: Secondary | ICD-10-CM

## 2013-07-21 DIAGNOSIS — R937 Abnormal findings on diagnostic imaging of other parts of musculoskeletal system: Secondary | ICD-10-CM

## 2013-07-31 ENCOUNTER — Encounter: Payer: Self-pay | Admitting: *Deleted

## 2013-07-31 ENCOUNTER — Ambulatory Visit (INDEPENDENT_AMBULATORY_CARE_PROVIDER_SITE_OTHER): Payer: Medicare Other | Admitting: Family Medicine

## 2013-07-31 DIAGNOSIS — Z7901 Long term (current) use of anticoagulants: Secondary | ICD-10-CM

## 2013-07-31 DIAGNOSIS — Z5181 Encounter for therapeutic drug level monitoring: Secondary | ICD-10-CM

## 2013-07-31 DIAGNOSIS — I4891 Unspecified atrial fibrillation: Secondary | ICD-10-CM

## 2013-07-31 DIAGNOSIS — E119 Type 2 diabetes mellitus without complications: Secondary | ICD-10-CM

## 2013-07-31 DIAGNOSIS — I25810 Atherosclerosis of coronary artery bypass graft(s) without angina pectoris: Secondary | ICD-10-CM

## 2013-07-31 LAB — HEMOGLOBIN A1C: HEMOGLOBIN A1C: 6.9 % — AB (ref 4.6–6.5)

## 2013-07-31 LAB — POCT INR: INR: 2.3

## 2013-07-31 NOTE — Addendum Note (Signed)
Addended by: Liane ComberHAVERS, Muntaha Vermette C on: 07/31/2013 12:09 PM   Modules accepted: Orders

## 2013-08-01 ENCOUNTER — Encounter: Payer: Self-pay | Admitting: Family Medicine

## 2013-08-01 DIAGNOSIS — I2510 Atherosclerotic heart disease of native coronary artery without angina pectoris: Secondary | ICD-10-CM

## 2013-08-01 DIAGNOSIS — E119 Type 2 diabetes mellitus without complications: Secondary | ICD-10-CM

## 2013-08-01 DIAGNOSIS — I4891 Unspecified atrial fibrillation: Secondary | ICD-10-CM

## 2013-08-16 ENCOUNTER — Encounter: Payer: Self-pay | Admitting: *Deleted

## 2013-08-16 ENCOUNTER — Ambulatory Visit (INDEPENDENT_AMBULATORY_CARE_PROVIDER_SITE_OTHER): Payer: Medicare Other | Admitting: Internal Medicine

## 2013-08-16 ENCOUNTER — Encounter: Payer: Self-pay | Admitting: Internal Medicine

## 2013-08-16 VITALS — BP 110/60 | HR 68 | Temp 98.8°F | Wt 145.0 lb

## 2013-08-16 DIAGNOSIS — I10 Essential (primary) hypertension: Secondary | ICD-10-CM

## 2013-08-16 DIAGNOSIS — E119 Type 2 diabetes mellitus without complications: Secondary | ICD-10-CM

## 2013-08-16 DIAGNOSIS — M5137 Other intervertebral disc degeneration, lumbosacral region: Secondary | ICD-10-CM

## 2013-08-16 DIAGNOSIS — N400 Enlarged prostate without lower urinary tract symptoms: Secondary | ICD-10-CM

## 2013-08-16 DIAGNOSIS — M1120 Other chondrocalcinosis, unspecified site: Secondary | ICD-10-CM

## 2013-08-16 DIAGNOSIS — E785 Hyperlipidemia, unspecified: Secondary | ICD-10-CM

## 2013-08-16 DIAGNOSIS — I25810 Atherosclerosis of coronary artery bypass graft(s) without angina pectoris: Secondary | ICD-10-CM

## 2013-08-16 DIAGNOSIS — M5136 Other intervertebral disc degeneration, lumbar region: Secondary | ICD-10-CM

## 2013-08-16 DIAGNOSIS — M51379 Other intervertebral disc degeneration, lumbosacral region without mention of lumbar back pain or lower extremity pain: Secondary | ICD-10-CM

## 2013-08-16 DIAGNOSIS — M51369 Other intervertebral disc degeneration, lumbar region without mention of lumbar back pain or lower extremity pain: Secondary | ICD-10-CM

## 2013-08-16 DIAGNOSIS — I4891 Unspecified atrial fibrillation: Secondary | ICD-10-CM

## 2013-08-16 LAB — CBC WITH DIFFERENTIAL/PLATELET
Basophils Absolute: 0 10*3/uL (ref 0.0–0.1)
Basophils Relative: 0.3 % (ref 0.0–3.0)
EOS ABS: 0.1 10*3/uL (ref 0.0–0.7)
EOS PCT: 0.8 % (ref 0.0–5.0)
HEMATOCRIT: 41.7 % (ref 39.0–52.0)
HEMOGLOBIN: 13.9 g/dL (ref 13.0–17.0)
Lymphocytes Relative: 37 % (ref 12.0–46.0)
Lymphs Abs: 2.3 10*3/uL (ref 0.7–4.0)
MCHC: 33.3 g/dL (ref 30.0–36.0)
MCV: 100.8 fl — ABNORMAL HIGH (ref 78.0–100.0)
Monocytes Absolute: 0.7 10*3/uL (ref 0.1–1.0)
Monocytes Relative: 11.2 % (ref 3.0–12.0)
NEUTROS ABS: 3.2 10*3/uL (ref 1.4–7.7)
Neutrophils Relative %: 50.7 % (ref 43.0–77.0)
Platelets: 276 10*3/uL (ref 150.0–400.0)
RBC: 4.13 Mil/uL — ABNORMAL LOW (ref 4.22–5.81)
RDW: 14.1 % (ref 11.5–15.5)
WBC: 6.3 10*3/uL (ref 4.0–10.5)

## 2013-08-16 LAB — COMPREHENSIVE METABOLIC PANEL
ALK PHOS: 106 U/L (ref 39–117)
ALT: 19 U/L (ref 0–53)
AST: 22 U/L (ref 0–37)
Albumin: 3.7 g/dL (ref 3.5–5.2)
BILIRUBIN TOTAL: 0.6 mg/dL (ref 0.2–1.2)
BUN: 18 mg/dL (ref 6–23)
CO2: 29 mEq/L (ref 19–32)
Calcium: 9.2 mg/dL (ref 8.4–10.5)
Chloride: 102 mEq/L (ref 96–112)
Creatinine, Ser: 1 mg/dL (ref 0.4–1.5)
GFR: 70.78 mL/min (ref >60.00)
Glucose, Bld: 121 mg/dL — ABNORMAL HIGH (ref 70–99)
POTASSIUM: 4.8 meq/L (ref 3.5–5.1)
Sodium: 138 mEq/L (ref 135–145)
Total Protein: 6.9 g/dL (ref 6.0–8.3)

## 2013-08-16 LAB — LIPID PANEL
CHOL/HDL RATIO: 3
Cholesterol: 145 mg/dL (ref 0–200)
HDL: 47.1 mg/dL (ref >39.00)
LDL Cholesterol: 62 mg/dL (ref 0–99)
Triglycerides: 180 mg/dL — ABNORMAL HIGH (ref 0.0–149.0)
VLDL: 36 mg/dL (ref 0.0–40.0)

## 2013-08-16 LAB — T4, FREE: Free T4: 0.86 ng/dL (ref 0.60–1.60)

## 2013-08-16 LAB — HM DIABETES FOOT EXAM

## 2013-08-16 LAB — MICROALBUMIN / CREATININE URINE RATIO
CREATININE, U: 63.7 mg/dL
MICROALB/CREAT RATIO: 0.3 mg/g (ref 0.0–30.0)
Microalb, Ur: 0.2 mg/dL (ref 0.0–1.9)

## 2013-08-16 LAB — TSH: TSH: 0.47 u[IU]/mL (ref 0.35–4.50)

## 2013-08-16 NOTE — Assessment & Plan Note (Signed)
Still well controlled No changes needed

## 2013-08-16 NOTE — Progress Notes (Signed)
Pre visit review using our clinic review tool, if applicable. No additional management support is needed unless otherwise documented below in the visit note. 

## 2013-08-16 NOTE — Assessment & Plan Note (Signed)
Better Has the colchicine in case

## 2013-08-16 NOTE — Assessment & Plan Note (Signed)
Just finished therapy Discussed core program Uses just tylenol

## 2013-08-16 NOTE — Assessment & Plan Note (Signed)
Mild symptoms only 

## 2013-08-16 NOTE — Assessment & Plan Note (Signed)
BP Readings from Last 3 Encounters:  08/16/13 110/60  07/18/13 120/60  07/11/13 128/64   Good control

## 2013-08-16 NOTE — Assessment & Plan Note (Signed)
On statin Due for labs 

## 2013-08-16 NOTE — Patient Instructions (Signed)
Please set up an appointment with a dermatologist to check that spot on your chest. Talk to Casimiro NeedleMichael the fitness director about a core strengthening program.

## 2013-08-16 NOTE — Assessment & Plan Note (Signed)
Good rate control On coumadin 

## 2013-08-16 NOTE — Progress Notes (Signed)
Subjective:    Patient ID: [name], male    DOB: 08-23-1917, 80 y.o.   MRN: [id number]  HPI Here with wife Knee is better Did therapy--seemed to make his back worse. It did help his knee. Hasn't needed the colchicine Hasn't been able to get back to his walking--do has a cane  Checking sugars regularly Some more fluctuation but still fairly low No hypoglycemic reactions  More indigestion pain--has tried baking soda with success. Nitro doesn't help No palpitations but he does note irregular pulse No dizziness  No daytime frequency but some urgency Stable nocturia  Current Outpatient Prescriptions on File Prior to Visit  Medication Sig Dispense Refill  . amLODipine (NORVASC) 10 MG tablet Take one tablet by mouth one time daily  90 tablet  2  . Ascorbic Acid (VITAMIN C) 1000 MG tablet Take 1,000 mg by mouth daily.        Marland Kitchen. aspirin 81 MG tablet Take 81 mg by mouth daily.        . B Complex-C-E-Zn (BEC/ZINC) TABS Take 1 tablet by mouth daily.        . colchicine 0.6 MG tablet Take 1 tablet (0.6 mg total) by mouth 2 (two) times daily as needed.  60 tablet  0  . glimepiride (AMARYL) 1 MG tablet TAKE ONE TABLET BY MOUTH EVERY MORNING  BEDFORE  BREAKFAST    90 tablet  1  . glucose blood (ONE TOUCH ULTRA TEST) test strip Use as instructed to test blood sugar twice daily dx: 250.00  200 each  3  . nitroGLYCERIN (NITROSTAT) 0.4 MG SL tablet Place 1 tablet (0.4 mg total) under the tongue every 5 (five) minutes as needed.  25 tablet  11  . rosuvastatin (CRESTOR) 20 MG tablet Take 0.5 tablets (10 mg total) by mouth daily.  15 tablet  0  . traMADol (ULTRAM) 50 MG tablet Take 0.5-1 tablets (25-50 mg total) by mouth 3 (three) times daily as needed.  60 tablet  0  . warfarin (COUMADIN) 5 MG tablet TAKE 1 TABLET BY MOUTH EVERY DAY TAKE AS DIRECTED BY COUMADIN CLINIC   90 tablet  0  . Zn-Pyg Afri-Nettle-Saw Palmet (SAW PALMETTO COMPLEX) CAPS Take 1 capsule by mouth daily.         No current  facility-administered medications on file prior to visit.    Allergies  Allergen Reactions  . Cefuroxime Axetil     REACTION: unspecified  . Heparin     REACTION: rash    Past Medical History  Diagnosis Date  . CAD (coronary artery disease) 1974    Dr. Riley KillStuckey   . DM2 (diabetes mellitus, type 2) 2003  . HTN (hypertension)   . BPH (benign prostatic hypertrophy)   . Thyroid cyst     incidental finding on ultra sound  . Atrial fibrillation   . Hyperlipidemia   . SSS (sick sinus syndrome)   . Pseudogout 4/15    right knee  . Lumbar degenerative disc disease 2015    Past Surgical History  Procedure Laterality Date  . Coronary artery bypass graft  1994  . Permanent pacemaker  04/09/06    dual chamber permanent pacemaker  . Tonsillectomy      childhood  . Pilonidal cystectomy      Family History  Problem Relation Age of Onset  . Colon cancer Neg Hx   . Prostate cancer Neg Hx     History   Social History  . Marital Status:  Married    Spouse Name: N/A    Number of Children: N/A  . Years of Education: N/A   Occupational History  . retired Airline pilotsales for [employer]    Social History Main Topics  . Smoking status: Former Games developermoker  . Smokeless tobacco: Never Used  . Alcohol Use: No  . Drug Use: No  . Sexual Activity: Not on file   Other Topics Concern  . Not on file   Social History Narrative    Has a living will- wife or attorney Zebedee IbaRon Johnson to have health care POA. Would accept resuscitation but no prolonged machines, not sure about feeding tube.            Review of Systems Appetite is off some--- has been taking some glucerna Weight back up 5# Some constipation---needs prunes Sleeps fine--back calms down and he can settle in Nocturia x 2    Objective:   Physical Exam  Constitutional: He appears well-developed and well-nourished. No distress.  Neck: Normal range of motion. Neck supple. No thyromegaly present.  Cardiovascular: Normal rate, normal  heart sounds and intact distal pulses.  Exam reveals no gallop.   No murmur heard. irregular  Pulmonary/Chest: Effort normal and breath sounds normal. No respiratory distress. He has no wheezes. He has no rales.  Abdominal: Soft. There is no tenderness.  Musculoskeletal: He exhibits no edema and no tenderness.  Lymphadenopathy:    He has no cervical adenopathy.  Neurological:  Normal sensation in feet  Skin: No rash noted.  No foot lesions Suspicious lesion on right upper chest  Psychiatric: He has a normal mood and affect. His behavior is normal.          Assessment & Plan:

## 2013-08-22 ENCOUNTER — Encounter: Payer: Self-pay | Admitting: Internal Medicine

## 2013-08-22 ENCOUNTER — Ambulatory Visit (INDEPENDENT_AMBULATORY_CARE_PROVIDER_SITE_OTHER): Payer: Medicare Other | Admitting: Internal Medicine

## 2013-08-22 VITALS — BP 138/60 | HR 98 | Temp 98.4°F | Wt 142.0 lb

## 2013-08-22 DIAGNOSIS — M5136 Other intervertebral disc degeneration, lumbar region: Secondary | ICD-10-CM

## 2013-08-22 DIAGNOSIS — I25810 Atherosclerosis of coronary artery bypass graft(s) without angina pectoris: Secondary | ICD-10-CM

## 2013-08-22 DIAGNOSIS — M5137 Other intervertebral disc degeneration, lumbosacral region: Secondary | ICD-10-CM

## 2013-08-22 MED ORDER — TRAMADOL HCL 50 MG PO TABS: 25.0000 mg | ORAL_TABLET | Freq: Three times a day (TID) | ORAL | Status: AC | PRN

## 2013-08-22 NOTE — Progress Notes (Signed)
Subjective:    Patient ID: [name], male    DOB: 01-11-1918, 80 y.o.   MRN: [id number]  HPI Here with wife Needs more help with the back pain Okay in bed (eases up over a couple of minutes) or sitting leaning over Tylenol not helping Has the tramadol he thinks---but not using  Pain is in mid back--by the ribs Has been doing light weights with upper body Does do some back extension No prolonged walking  Current Outpatient Prescriptions on File Prior to Visit  Medication Sig Dispense Refill  . amLODipine (NORVASC) 10 MG tablet Take one tablet by mouth one time daily  90 tablet  2  . Ascorbic Acid (VITAMIN C) 1000 MG tablet Take 1,000 mg by mouth daily.        Marland Kitchen. aspirin 81 MG tablet Take 81 mg by mouth daily.        . B Complex-C-E-Zn (BEC/ZINC) TABS Take 1 tablet by mouth daily.        . colchicine 0.6 MG tablet Take 1 tablet (0.6 mg total) by mouth 2 (two) times daily as needed.  60 tablet  0  . glimepiride (AMARYL) 1 MG tablet TAKE ONE TABLET BY MOUTH EVERY MORNING  BEDFORE  BREAKFAST    90 tablet  1  . glucose blood (ONE TOUCH ULTRA TEST) test strip Use as instructed to test blood sugar twice daily dx: 250.00  200 each  3  . nitroGLYCERIN (NITROSTAT) 0.4 MG SL tablet Place 1 tablet (0.4 mg total) under the tongue every 5 (five) minutes as needed.  25 tablet  11  . rosuvastatin (CRESTOR) 20 MG tablet Take 0.5 tablets (10 mg total) by mouth daily.  15 tablet  0  . traMADol (ULTRAM) 50 MG tablet Take 0.5-1 tablets (25-50 mg total) by mouth 3 (three) times daily as needed.  60 tablet  0  . warfarin (COUMADIN) 5 MG tablet TAKE 1 TABLET BY MOUTH EVERY DAY TAKE AS DIRECTED BY COUMADIN CLINIC   90 tablet  0  . Zn-Pyg Afri-Nettle-Saw Palmet (SAW PALMETTO COMPLEX) CAPS Take 1 capsule by mouth daily.         No current facility-administered medications on file prior to visit.    Allergies  Allergen Reactions  . Cefuroxime Axetil     REACTION: unspecified  . Heparin    REACTION: rash    Past Medical History  Diagnosis Date  . CAD (coronary artery disease) 1974    Dr. Riley KillStuckey   . DM2 (diabetes mellitus, type 2) 2003  . HTN (hypertension)   . BPH (benign prostatic hypertrophy)   . Thyroid cyst     incidental finding on ultra sound  . Atrial fibrillation   . Hyperlipidemia   . SSS (sick sinus syndrome)   . Pseudogout 4/15    right knee  . Lumbar degenerative disc disease 2015    Past Surgical History  Procedure Laterality Date  . Coronary artery bypass graft  1994  . Permanent pacemaker  04/09/06    dual chamber permanent pacemaker  . Tonsillectomy      childhood  . Pilonidal cystectomy      Family History  Problem Relation Age of Onset  . Colon cancer Neg Hx   . Prostate cancer Neg Hx     History   Social History  . Marital Status: Married    Spouse Name: N/A    Number of Children: N/A  . Years of Education: N/A   Occupational  History  . retired Airline pilot for [employer]    Social History Main Topics  . Smoking status: Former Games developer  . Smokeless tobacco: Never Used  . Alcohol Use: No  . Drug Use: No  . Sexual Activity: Not on file   Other Topics Concern  . Not on file   Social History Narrative    Has a living will- wife or attorney [name] to have health care POA. Would accept resuscitation but no prolonged machines, not sure about feeding tube.            Review of Systems Appetite is off some No leg weakness Some constipation---trying prunes and metamucil No loss of bladder control---just slight urge leakage (before he makes it to bathroom)    Objective:   Physical Exam  Constitutional: He appears well-developed. No distress.  Musculoskeletal:  No spine tenderness SLR negative   Neurological:  Walks hunched over but not ataxic No leg weakness          Assessment & Plan:

## 2013-08-22 NOTE — Patient Instructions (Signed)
Please restart the tramadol at 1/2 to 1 tablet up to three times a day. Work on Patent examiner--- speak to Casimiro Needle about this. Hold off on the upper body weight training for now

## 2013-08-22 NOTE — Assessment & Plan Note (Signed)
Discussed holding off on upper body lifting (even though light)--symptoms may be more muscular now Needs to work more on core Will add the tramadol--he hasn't been taking Would consider physiatrist to consider ESI--but no worrisome neuro findings right now

## 2013-08-25 ENCOUNTER — Ambulatory Visit: Payer: Medicare Other | Admitting: Internal Medicine

## 2013-08-25 ENCOUNTER — Encounter: Payer: Self-pay | Admitting: Internal Medicine

## 2013-08-30 ENCOUNTER — Other Ambulatory Visit: Payer: Self-pay | Admitting: Internal Medicine

## 2013-08-30 MED ORDER — PREDNISONE 20 MG PO TABS: 40.0000 mg | ORAL_TABLET | Freq: Every day | ORAL | Status: AC

## 2013-08-30 MED ORDER — HYDROCODONE-ACETAMINOPHEN 5-325 MG PO TABS: 1.0000 | ORAL_TABLET | ORAL | Status: DC | PRN

## 2013-08-30 NOTE — Telephone Encounter (Signed)
Spoke with patient and advised results   

## 2013-08-30 NOTE — Telephone Encounter (Signed)
Have him see if the prednisone helps first You don't do both at the same time

## 2013-08-30 NOTE — Telephone Encounter (Signed)
Pt called asking for something different and stronger, he states his pain is worse today and want something done. Please advise

## 2013-08-30 NOTE — Telephone Encounter (Signed)
He can't do both the hydrocodone and prednisone?

## 2013-08-30 NOTE — Telephone Encounter (Signed)
Spoke with patient and advised rx ready for pick-up and it will be at the front desk.  Pt would like the referral for epidural cortisone shot

## 2013-08-30 NOTE — Telephone Encounter (Signed)
Have him try the hydrocodone He should let me see if he wants referral to consider epidural cortisone shot  I also sent a prescription for oral cortisone (prednisone)since that occasionally helps with nerve pain from disc disease

## 2013-08-30 NOTE — Telephone Encounter (Signed)
Yes--he can use both I want to hold off on the referral to consider epidural steroid till after we see the effect of the prednisone

## 2013-09-08 ENCOUNTER — Other Ambulatory Visit: Payer: Self-pay | Admitting: Internal Medicine

## 2013-09-08 MED ORDER — HYDROCODONE-ACETAMINOPHEN 5-325 MG PO TABS: 1.0000 | ORAL_TABLET | ORAL | Status: DC | PRN

## 2013-09-08 NOTE — Telephone Encounter (Signed)
Pt walked in asking for refill of hydrocodone, he's taking 6 per day and the pain is still bad, pt took his last prednisone today and states that didn't help. Nicki Reaperegina Baity, NP gave another #60 to last until Dr. Alphonsus SiasLetvak returns. Pt would like to proceed with steroid injection referral.

## 2013-09-11 ENCOUNTER — Ambulatory Visit: Payer: Medicare Other

## 2013-09-12 ENCOUNTER — Telehealth: Payer: Self-pay

## 2013-09-12 NOTE — Telephone Encounter (Signed)
Rosalita ChessmanSuzanne with [facility] left v/m; Rosalita ChessmanSuzanne plans to discharge pt from home health services on 09/14/13. Rosalita ChessmanSuzanne wants to make sure OK with doctor for [facility] to [facility].Please advise. +

## 2013-09-12 NOTE — Telephone Encounter (Signed)
Spoke with nurse and gave verbal ok

## 2013-09-12 NOTE — Telephone Encounter (Signed)
Should be ok,

## 2013-09-14 ENCOUNTER — Other Ambulatory Visit: Payer: Self-pay | Admitting: Internal Medicine

## 2013-09-14 ENCOUNTER — Ambulatory Visit (INDEPENDENT_AMBULATORY_CARE_PROVIDER_SITE_OTHER): Payer: Medicare Other | Admitting: Family Medicine

## 2013-09-14 DIAGNOSIS — I4891 Unspecified atrial fibrillation: Secondary | ICD-10-CM

## 2013-09-14 DIAGNOSIS — Z5181 Encounter for therapeutic drug level monitoring: Secondary | ICD-10-CM

## 2013-09-14 DIAGNOSIS — M48061 Spinal stenosis, lumbar region without neurogenic claudication: Secondary | ICD-10-CM

## 2013-09-14 DIAGNOSIS — I25810 Atherosclerosis of coronary artery bypass graft(s) without angina pectoris: Secondary | ICD-10-CM

## 2013-09-14 DIAGNOSIS — Z7901 Long term (current) use of anticoagulants: Secondary | ICD-10-CM

## 2013-09-14 LAB — POCT INR: INR: 3.8

## 2013-09-14 NOTE — Telephone Encounter (Signed)
Referral to Dr. Yves Dillhasnis, physical medicine placed

## 2013-09-18 ENCOUNTER — Telehealth: Payer: Self-pay | Admitting: Internal Medicine

## 2013-09-18 MED ORDER — HYDROCODONE-ACETAMINOPHEN 5-325 MG PO TABS: 1.0000 | ORAL_TABLET | ORAL | Status: AC | PRN

## 2013-09-18 NOTE — Telephone Encounter (Signed)
Patient came to my office in person asking to get a refill on the Hydocodone as he only has 8 pills left. He also came in person last Friday to ask for a Referral for his back pain. I called Dr Yves Dillhasnis office and faxed  over the records on your patient  to request a consult for steroid injections. They said it could take about 4 weeks to get him in for an appt and the patient is aware that it could take this long and doesn't want to be out of his pain medicine.

## 2013-09-18 NOTE — Telephone Encounter (Signed)
Rx written Increased quantity to #90

## 2013-09-18 NOTE — Telephone Encounter (Signed)
Spoke with patient and advised rx ready for pick-up and it will be at the front desk.  

## 2013-09-20 ENCOUNTER — Other Ambulatory Visit: Payer: Self-pay | Admitting: Emergency Medicine

## 2013-09-21 ENCOUNTER — Other Ambulatory Visit: Payer: Self-pay | Admitting: Internal Medicine

## 2013-09-21 DIAGNOSIS — I509 Heart failure, unspecified: Secondary | ICD-10-CM

## 2013-09-21 DIAGNOSIS — I4891 Unspecified atrial fibrillation: Secondary | ICD-10-CM

## 2013-09-21 DIAGNOSIS — J9600 Acute respiratory failure, unspecified whether with hypoxia or hypercapnia: Secondary | ICD-10-CM

## 2013-09-25 ENCOUNTER — Telehealth: Payer: Self-pay | Admitting: Internal Medicine

## 2013-09-25 NOTE — Telephone Encounter (Signed)
Took message off my machine from Mrs [name] asking me to please cancel all Mr [name] CharRichs appts because he has died.I have cancelled all of ours and also called Dr Yves Dillhasnis office and cancelled that one as well. I left Mrs [name] a message letting her know that we did cancel all of the appts.

## 2013-09-25 NOTE — Telephone Encounter (Signed)
Tried to reach her to extend condolences Will try again

## 2013-09-27 DEATH — deceased

## 2013-09-28 ENCOUNTER — Telehealth: Payer: Self-pay

## 2013-09-28 NOTE — Telephone Encounter (Signed)
[facility] per Ileene Hutchinsonbituary

## 2013-10-26 ENCOUNTER — Ambulatory Visit: Payer: Medicare Other

## 2013-10-30 ENCOUNTER — Ambulatory Visit: Payer: Medicare Other

## 2014-02-23 ENCOUNTER — Encounter: Payer: Medicare Other | Admitting: Internal Medicine

## 2014-10-04 ENCOUNTER — Encounter: Payer: Self-pay | Admitting: Internal Medicine

## 2015-01-29 IMAGING — CR DG LUMBAR SPINE COMPLETE 4+V
5 series · 5 of 5 positions shown · non-contrast
Comparison: None.

CLINICAL DATA: Pain.

EXAM:
LUMBAR SPINE - COMPLETE 4+ VIEW

[view not recorded (1 of 5)]
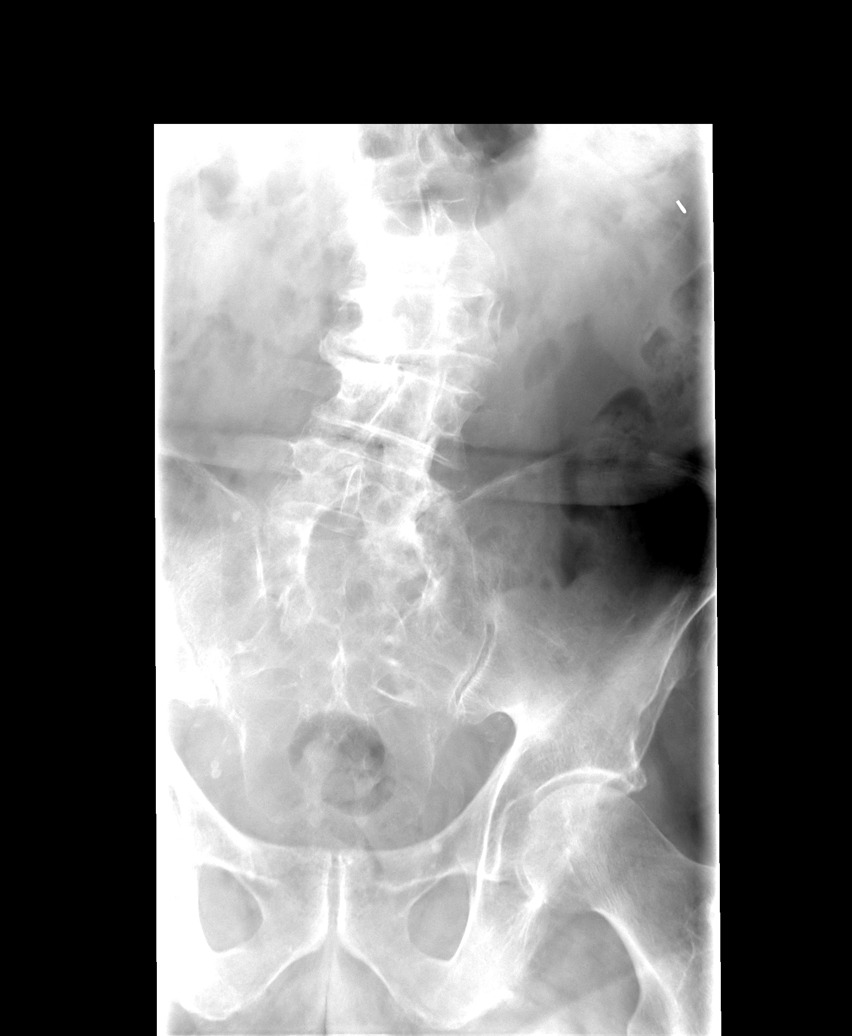

[view not recorded (2 of 5)]
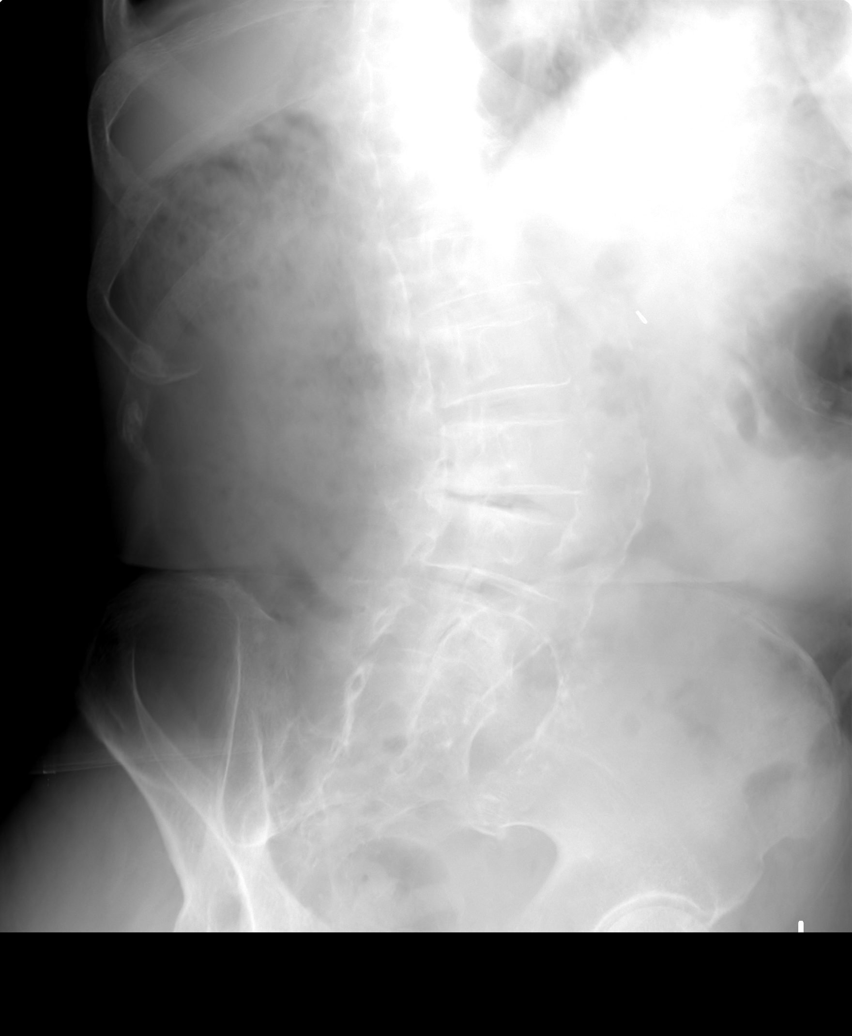

[view not recorded (3 of 5)]
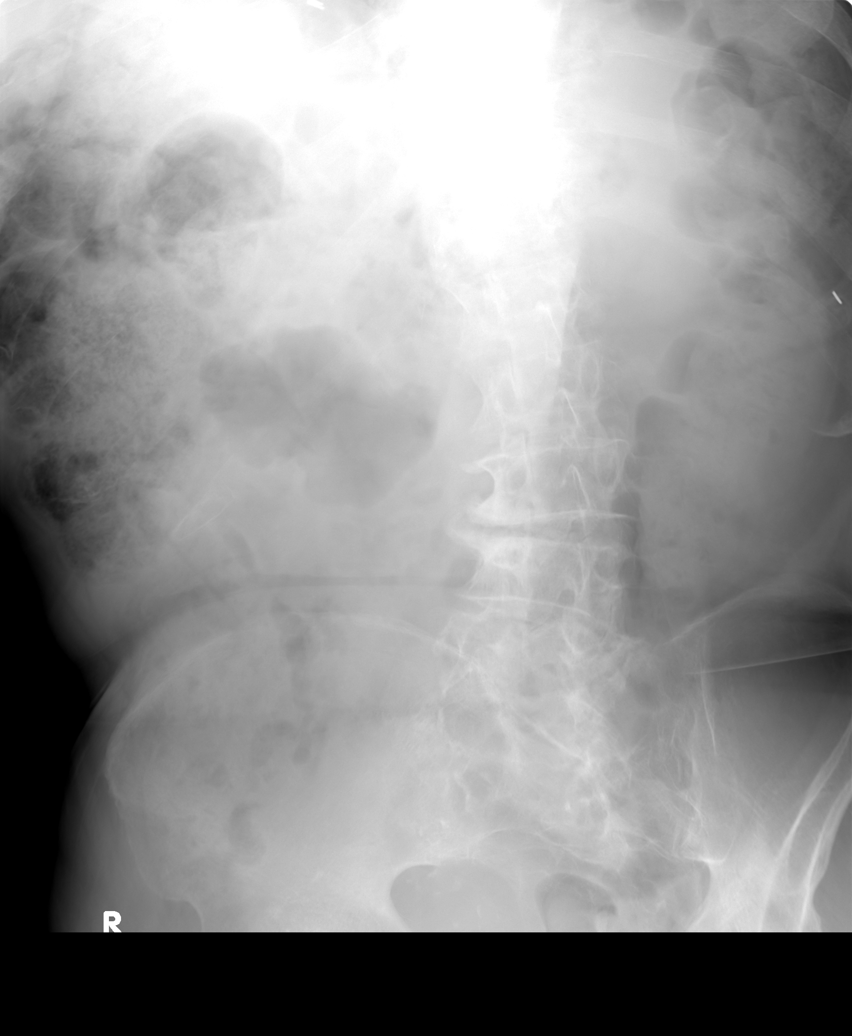

[view not recorded (4 of 5)]
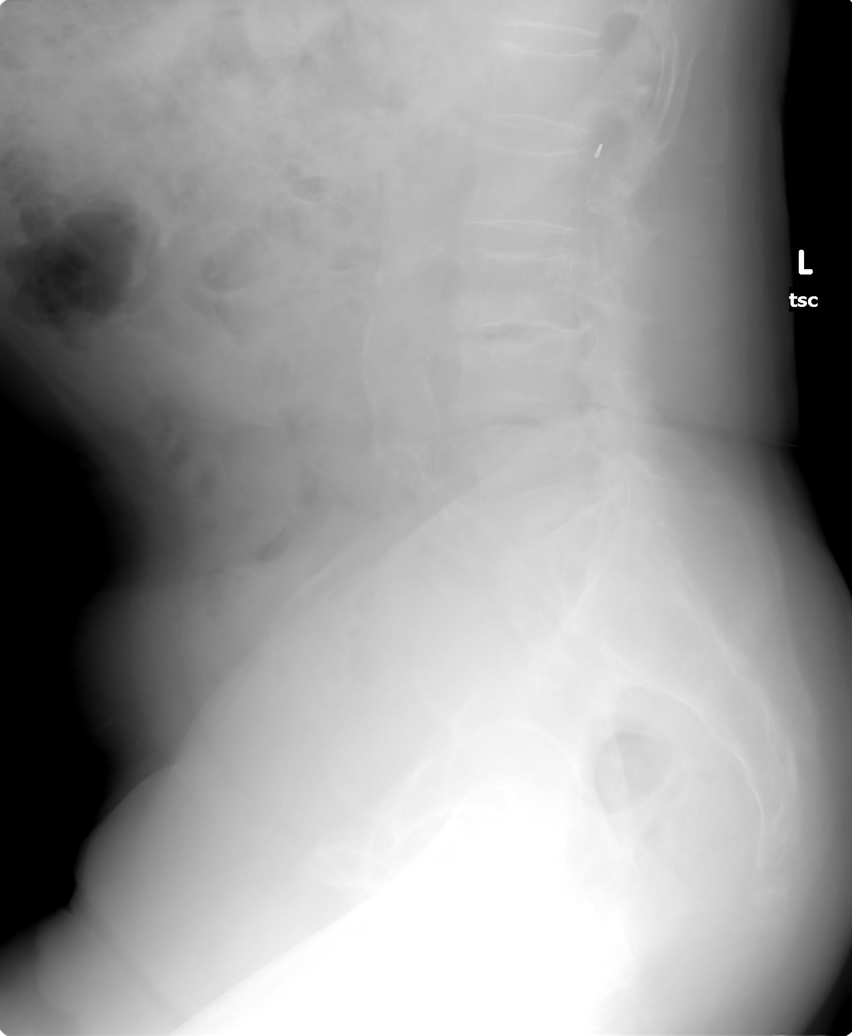

[view not recorded (5 of 5)]
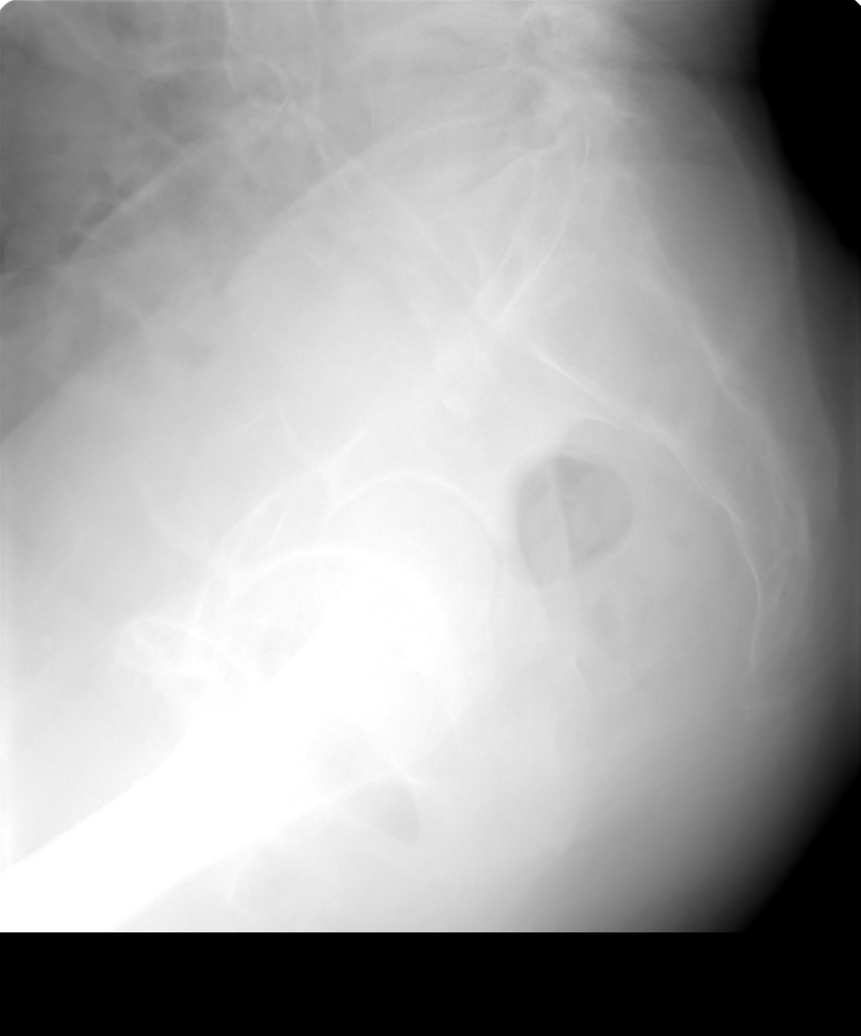

[5 of 5 positions shown; findings below may reference images not displayed]

FINDINGS: Diffuse degenerative change of the lumbar spine with prominent
scoliosis concave right noted. Multilevel disc degeneration is
present. Diffuse facet hypertrophy noted. Surgical clips noted over
upper abdomen. Aortoiliac atherosclerotic vascular disease noted.
Aneurysmal dilatation of the common iliac arteries cannot be
excluded. CTA of the abdomen and pelvis suggested for further
evaluation. Pelvic phleboliths.
IMPRESSION: 1. Severe diffuse lumbar spine degenerative changes scoliosis
concave right.
2. Aortoiliac atherosclerotic vascular disease with possible
aneurysmal dilatation of the common iliac arteries. CTA of the
abdomen and pelvis suggested for further evaluation.
